# Patient Record
Sex: Female | Born: 1945 | Race: Black or African American | Hispanic: No | Smoking: Current every day smoker
Health system: Southern US, Community
[De-identification: ages and names within clinical notes are randomized; demographics above are authoritative.]

## PROBLEM LIST (undated history)

## (undated) DIAGNOSIS — I10 Essential (primary) hypertension: Secondary | ICD-10-CM

## (undated) DIAGNOSIS — E119 Type 2 diabetes mellitus without complications: Secondary | ICD-10-CM

---

## 2020-08-27 ENCOUNTER — Encounter (HOSPITAL_COMMUNITY): Payer: Self-pay | Admitting: Emergency Medicine

## 2020-08-27 ENCOUNTER — Inpatient Hospital Stay (HOSPITAL_COMMUNITY)
Admission: EM | Admit: 2020-08-27 | Discharge: 2020-08-29 | DRG: 310 | Disposition: A | Discharge status: Home Health | Payer: Medicare Other | Attending: Internal Medicine | Admitting: Internal Medicine

## 2020-08-27 ENCOUNTER — Inpatient Hospital Stay (HOSPITAL_COMMUNITY): Payer: Medicare Other

## 2020-08-27 ENCOUNTER — Emergency Department (HOSPITAL_COMMUNITY): Payer: Medicare Other

## 2020-08-27 ENCOUNTER — Other Ambulatory Visit: Payer: Self-pay

## 2020-08-27 DIAGNOSIS — I480 Paroxysmal atrial fibrillation: Secondary | ICD-10-CM | POA: Diagnosis present

## 2020-08-27 DIAGNOSIS — E1121 Type 2 diabetes mellitus with diabetic nephropathy: Secondary | ICD-10-CM | POA: Diagnosis not present

## 2020-08-27 DIAGNOSIS — N1832 Chronic kidney disease, stage 3b: Secondary | ICD-10-CM | POA: Diagnosis present

## 2020-08-27 DIAGNOSIS — Z888 Allergy status to other drugs, medicaments and biological substances status: Secondary | ICD-10-CM | POA: Diagnosis not present

## 2020-08-27 DIAGNOSIS — F17210 Nicotine dependence, cigarettes, uncomplicated: Secondary | ICD-10-CM | POA: Diagnosis present

## 2020-08-27 DIAGNOSIS — I129 Hypertensive chronic kidney disease with stage 1 through stage 4 chronic kidney disease, or unspecified chronic kidney disease: Secondary | ICD-10-CM | POA: Diagnosis present

## 2020-08-27 DIAGNOSIS — R079 Chest pain, unspecified: Secondary | ICD-10-CM

## 2020-08-27 DIAGNOSIS — R072 Precordial pain: Secondary | ICD-10-CM | POA: Diagnosis not present

## 2020-08-27 DIAGNOSIS — Z8249 Family history of ischemic heart disease and other diseases of the circulatory system: Secondary | ICD-10-CM

## 2020-08-27 DIAGNOSIS — K219 Gastro-esophageal reflux disease without esophagitis: Secondary | ICD-10-CM

## 2020-08-27 DIAGNOSIS — F419 Anxiety disorder, unspecified: Secondary | ICD-10-CM | POA: Diagnosis present

## 2020-08-27 DIAGNOSIS — Z885 Allergy status to narcotic agent status: Secondary | ICD-10-CM

## 2020-08-27 DIAGNOSIS — F32A Depression, unspecified: Secondary | ICD-10-CM | POA: Diagnosis present

## 2020-08-27 DIAGNOSIS — Z7984 Long term (current) use of oral hypoglycemic drugs: Secondary | ICD-10-CM | POA: Diagnosis not present

## 2020-08-27 DIAGNOSIS — E1122 Type 2 diabetes mellitus with diabetic chronic kidney disease: Secondary | ICD-10-CM | POA: Diagnosis present

## 2020-08-27 DIAGNOSIS — I4891 Unspecified atrial fibrillation: Secondary | ICD-10-CM

## 2020-08-27 DIAGNOSIS — Z833 Family history of diabetes mellitus: Secondary | ICD-10-CM | POA: Diagnosis not present

## 2020-08-27 DIAGNOSIS — F418 Other specified anxiety disorders: Secondary | ICD-10-CM

## 2020-08-27 DIAGNOSIS — I10 Essential (primary) hypertension: Secondary | ICD-10-CM

## 2020-08-27 DIAGNOSIS — Z79899 Other long term (current) drug therapy: Secondary | ICD-10-CM | POA: Diagnosis not present

## 2020-08-27 DIAGNOSIS — R413 Other amnesia: Secondary | ICD-10-CM | POA: Diagnosis present

## 2020-08-27 DIAGNOSIS — Z20822 Contact with and (suspected) exposure to covid-19: Secondary | ICD-10-CM | POA: Diagnosis present

## 2020-08-27 DIAGNOSIS — I472 Ventricular tachycardia: Secondary | ICD-10-CM | POA: Diagnosis not present

## 2020-08-27 DIAGNOSIS — R4189 Other symptoms and signs involving cognitive functions and awareness: Secondary | ICD-10-CM | POA: Diagnosis not present

## 2020-08-27 HISTORY — DX: Type 2 diabetes mellitus without complications: E11.9

## 2020-08-27 HISTORY — DX: Essential (primary) hypertension: I10

## 2020-08-27 LAB — BASIC METABOLIC PANEL
Anion gap: 10 (ref 5–15)
BUN: 29 mg/dL — ABNORMAL HIGH (ref 8–23)
CO2: 25 mmol/L (ref 22–32)
Calcium: 8.9 mg/dL (ref 8.9–10.3)
Chloride: 106 mmol/L (ref 98–111)
Creatinine, Ser: 1.2 mg/dL — ABNORMAL HIGH (ref 0.44–1.00)
GFR, Estimated: 44 mL/min — ABNORMAL LOW (ref >60)
Glucose, Bld: 126 mg/dL — ABNORMAL HIGH (ref 70–99)
Potassium: 3.7 mmol/L (ref 3.5–5.1)
Sodium: 141 mmol/L (ref 135–145)

## 2020-08-27 LAB — TROPONIN I (HIGH SENSITIVITY)
Troponin I (High Sensitivity): 25 ng/L — ABNORMAL HIGH (ref <18)
Troponin I (High Sensitivity): 150 ng/L (ref <18)

## 2020-08-27 LAB — TSH: TSH: 1.937 u[IU]/mL (ref 0.350–4.500)

## 2020-08-27 LAB — RESPIRATORY PANEL BY RT PCR (FLU A&B, COVID)
Influenza A by PCR: NEGATIVE
Influenza B by PCR: NEGATIVE
SARS Coronavirus 2 by RT PCR: NEGATIVE

## 2020-08-27 LAB — CBG MONITORING, ED
Glucose-Capillary: 150 mg/dL — ABNORMAL HIGH (ref 70–99)
Glucose-Capillary: 126 mg/dL — ABNORMAL HIGH (ref 70–99)

## 2020-08-27 LAB — CBC
HCT: 42.7 % (ref 36.0–46.0)
Hemoglobin: 13.4 g/dL (ref 12.0–15.0)
MCH: 29 pg (ref 26.0–34.0)
MCHC: 31.4 g/dL (ref 30.0–36.0)
MCV: 92.4 fL (ref 80.0–100.0)
Platelets: 261 10*3/uL (ref 150–400)
RBC: 4.62 MIL/uL (ref 3.87–5.11)
RDW: 14.3 % (ref 11.5–15.5)
WBC: 3.8 10*3/uL — ABNORMAL LOW (ref 4.0–10.5)
nRBC: 0 % (ref 0.0–0.2)

## 2020-08-27 LAB — ECHOCARDIOGRAM COMPLETE
Area-P 1/2: 2.22 cm2
Height: 64.5 in
S' Lateral: 1.76 cm
Weight: 2500.9 oz

## 2020-08-27 LAB — HEPARIN LEVEL (UNFRACTIONATED): Heparin Unfractionated: 0.94 IU/mL — ABNORMAL HIGH (ref 0.30–0.70)

## 2020-08-27 LAB — MAGNESIUM: Magnesium: 2.4 mg/dL (ref 1.7–2.4)

## 2020-08-27 LAB — BRAIN NATRIURETIC PEPTIDE: B Natriuretic Peptide: 67 pg/mL (ref 0.0–100.0)

## 2020-08-27 MED ORDER — DILTIAZEM HCL-DEXTROSE 125-5 MG/125ML-% IV SOLN (PREMIX)
5.0000 mg/h | INTRAVENOUS | Status: DC
  Administered 2020-08-27: 5 mg/h via INTRAVENOUS
  Filled 2020-08-27: qty 125

## 2020-08-27 MED ORDER — ONDANSETRON HCL 4 MG/2ML IJ SOLN: 4.0000 mg | Freq: Four times a day (QID) | INTRAMUSCULAR | Status: DC | PRN

## 2020-08-27 MED ORDER — DULOXETINE HCL 30 MG PO CPEP
30.0000 mg | ORAL_CAPSULE | Freq: Every day | ORAL | Status: DC
  Administered 2020-08-27 – 2020-08-29 (×3): 30 mg via ORAL
  Filled 2020-08-27 (×3): qty 1

## 2020-08-27 MED ORDER — DILTIAZEM HCL 60 MG PO TABS
60.0000 mg | ORAL_TABLET | Freq: Two times a day (BID) | ORAL | Status: DC
  Administered 2020-08-27 – 2020-08-29 (×4): 60 mg via ORAL
  Filled 2020-08-27: qty 2
  Filled 2020-08-27 (×3): qty 1

## 2020-08-27 MED ORDER — ALPRAZOLAM 0.25 MG PO TABS
0.2500 mg | ORAL_TABLET | Freq: Two times a day (BID) | ORAL | Status: DC | PRN
  Administered 2020-08-28: 0.25 mg via ORAL
  Filled 2020-08-27: qty 1

## 2020-08-27 MED ORDER — METOPROLOL TARTRATE 50 MG PO TABS
50.0000 mg | ORAL_TABLET | Freq: Two times a day (BID) | ORAL | Status: DC
  Administered 2020-08-27 – 2020-08-29 (×3): 50 mg via ORAL
  Filled 2020-08-27 (×4): qty 1

## 2020-08-27 MED ORDER — PANTOPRAZOLE SODIUM 40 MG PO TBEC
40.0000 mg | DELAYED_RELEASE_TABLET | Freq: Every day | ORAL | Status: DC
  Administered 2020-08-27 – 2020-08-29 (×3): 40 mg via ORAL
  Filled 2020-08-27 (×3): qty 1

## 2020-08-27 MED ORDER — HEPARIN (PORCINE) 25000 UT/250ML-% IV SOLN
900.0000 [IU]/h | INTRAVENOUS | Status: DC
  Administered 2020-08-27: 1150 [IU]/h via INTRAVENOUS
  Administered 2020-08-28: 900 [IU]/h via INTRAVENOUS
  Filled 2020-08-27: qty 250

## 2020-08-27 MED ORDER — INSULIN DETEMIR 100 UNIT/ML ~~LOC~~ SOLN
5.0000 [IU] | Freq: Every day | SUBCUTANEOUS | Status: DC
  Administered 2020-08-29: 5 [IU] via SUBCUTANEOUS
  Filled 2020-08-27 (×4): qty 0.05

## 2020-08-27 MED ORDER — INSULIN ASPART 100 UNIT/ML ~~LOC~~ SOLN
0.0000 [IU] | Freq: Three times a day (TID) | SUBCUTANEOUS | Status: DC
  Administered 2020-08-27 (×2): 1 [IU] via SUBCUTANEOUS
  Administered 2020-08-28: 2 [IU] via SUBCUTANEOUS
  Administered 2020-08-28 – 2020-08-29 (×2): 1 [IU] via SUBCUTANEOUS
  Filled 2020-08-27 (×2): qty 1

## 2020-08-27 MED ORDER — ONDANSETRON HCL 4 MG PO TABS: 4.0000 mg | ORAL_TABLET | Freq: Four times a day (QID) | ORAL | Status: DC | PRN

## 2020-08-27 MED ORDER — HEPARIN BOLUS VIA INFUSION
4000.0000 [IU] | Freq: Once | INTRAVENOUS | Status: AC
  Administered 2020-08-27: 4000 [IU] via INTRAVENOUS

## 2020-08-27 MED ORDER — ACETAMINOPHEN 325 MG PO TABS
650.0000 mg | ORAL_TABLET | ORAL | Status: DC | PRN
  Administered 2020-08-28: 650 mg via ORAL
  Filled 2020-08-27: qty 2

## 2020-08-27 MED ORDER — SODIUM CHLORIDE 0.9 % IV BOLUS
500.0000 mL | Freq: Once | INTRAVENOUS | Status: AC
  Administered 2020-08-27: 500 mL via INTRAVENOUS

## 2020-08-27 NOTE — ED Notes (Signed)
EKG done pt in SR 60's.

## 2020-08-27 NOTE — Progress Notes (Signed)
*  PRELIMINARY RESULTS* Echocardiogram 2D Echocardiogram has been performed.  Jeryl Columbia 08/27/2020, 12:33 PM

## 2020-08-27 NOTE — ED Notes (Signed)
Date and time results received: 08/27/20 1049   Test: TROP Critical Value: 150  Name of Provider Notified: LONG  Orders Received? Or Actions Taken?: Waiting orders, nurse aware

## 2020-08-27 NOTE — H&P (Signed)
History and Physical    Divya Munshi ZOX:[id number] DOB: 31-May-1946 DOA: 08/27/2020  PCP: Pcp, No   Patient coming from: home  Chief Complaint: chest discomfort and SOB.  HPI: Felicia Randolph is a 74 y.o. female with medical history significant of hypertension, gastroesophageal reflux disease, type 2 diabetes and depression; who presented to the hospital secondary to chest discomfort and shortness of breath.  Patient reports symptoms have been present on and off for the last for 5 days but is slightly more severe on the morning of presentation; pain is localized in the medial left side aspect of her chest, nonradiating, 6 out of 10 in intensity and lasting over 15-20 minutes.  Patient reports no fever, no coughing, no shortness of breath, no nausea, no vomiting, no abdominal pain, no hematuria, no dysuria, no sick contacts or any other complaints.  She reports no close follow-up with her primary care doctor and recently some medication noncompliance.  ED Course: Work-up demonstrated patient to be in A. fib with RVR on presentation, mild elevation in her troponin and improvement in her symptoms after controlling rate with Cardizem drip.  TRH has been contacted to place patient in hospital for further evaluation and management.  Cardiology was also consulted due to mild elevation in her troponin.  Chest x-ray demonstrates no acute infiltrates or cardiopulmonary process.  Review of Systems: As per HPI otherwise all other systems reviewed and are negative.  Past Medical History:  Diagnosis Date  . Hypertension     History reviewed. No pertinent surgical history.  Social History  reports that she has been smoking. She has been smoking about 0.50 packs per day. She has never used smokeless tobacco. She reports current alcohol use. She reports that she does not use drugs.  Allergies  Allergen Reactions  . Ace Inhibitors Other (See Comments)  . Hydrocodone-Acetaminophen Anxiety  .  Oxycodone Anxiety    agitation and confusion   Family history: According to patient history of hypertension otherwise noncontributory.  Prior to Admission medications   Medication Sig Start Date End Date Taking? Authorizing Provider  Aspirin-Salicylamide-Caffeine (BC FAST PAIN RELIEF) [phone] MG PACK Take 1 packet by mouth as needed.   Yes [provider]  baclofen (LIORESAL) 10 MG tablet Take 5 mg by mouth 2 (two) times daily as needed. Patient not taking: Reported on 08/27/2020    [provider]  Cholecalciferol 50 MCG (2000 UT) TABS Take 2,000 Units by mouth daily. Patient not taking: Reported on 08/27/2020 08/05/15   [provider]  DULoxetine (CYMBALTA) 60 MG capsule Take 60 mg by mouth daily. Patient not taking: Reported on 08/27/2020 08/05/15   [provider]  metFORMIN (GLUCOPHAGE) 500 MG tablet Take 500 mg by mouth daily. Patient not taking: Reported on 08/27/2020 08/05/15   [provider]  metoprolol tartrate (LOPRESSOR) 50 MG tablet Take 50 mg by mouth in the morning and at bedtime. Patient not taking: Reported on 08/27/2020    [provider]  omeprazole (PRILOSEC) 20 MG capsule Take 20 mg by mouth daily. Patient not taking: Reported on 08/27/2020    [provider]    Physical Exam: Vitals:   08/27/20 0846 08/27/20 0900 08/27/20 0930 08/27/20 1000  BP:  (!) 123/104 125/90 (!) 128/95  Pulse:  (!) 133 (!) 133 (!) 143  Resp:  (!) 23 (!) 21 (!) 22  Temp:      TempSrc:      SpO2:  97% 99% 100%  Weight: 70.9 kg  Height: 5' 4.5" (1.638 m)       Constitutional: Afebrile, not nausea, no vomiting, no fever.  Reports improvement in her chest discomfort. Vitals:   08/27/20 0846 08/27/20 0900 08/27/20 0930 08/27/20 1000  BP:  (!) 123/104 125/90 (!) 128/95  Pulse:  (!) 133 (!) 133 (!) 143  Resp:  (!) 23 (!) 21 (!) 22  Temp:      TempSrc:      SpO2:  97% 99% 100%  Weight: 70.9 kg     Height: 5' 4.5"  (1.638 m)      Eyes: PERRL, lids and conjunctivae normal; no icterus, no nystagmus. ENMT: Mucous membranes are moist. Posterior pharynx clear of any exudate or lesions. Neck: normal, supple, no masses, no thyromegaly Respiratory: clear to auscultation bilaterally, no wheezing, no crackles.  No using accessory muscles. Cardiovascular: Irregular irregular; no rubs, no gallops, no JVD appreciated on exam.  Abdomen: no tenderness, no masses palpated. No hepatosplenomegaly. Bowel sounds positive.  Musculoskeletal: no clubbing / cyanosis. No joint deformity upper and lower extremities. Good ROM, no contractures. Normal muscle tone.  Skin: no rashes, no petechiae Neurologic: CN 2-12 grossly intact. Sensation intact, DTR normal. Strength 4/5 in 4 limbs due to poor effort. Psychiatric: Normal judgment and insight. Alert and oriented x 3. Normal mood.   Labs on Admission: I have personally reviewed following labs and imaging studies  CBC: Recent Labs  Lab 08/27/20 0835  WBC 3.8*  HGB 13.4  HCT 42.7  MCV 92.4  PLT 261    Basic Metabolic Panel: Recent Labs  Lab 08/27/20 0835  NA 141  K 3.7  CL 106  CO2 25  GLUCOSE 126*  BUN 29*  CREATININE 1.20*  CALCIUM 8.9    GFR: Estimated Creatinine Clearance: 39 mL/min (A) (by C-G formula based on SCr of 1.2 mg/dL (H)).  Urine analysis: No results found for: COLORURINE, APPEARANCEUR, LABSPEC, PHURINE, GLUCOSEU, HGBUR, BILIRUBINUR, KETONESUR, PROTEINUR, UROBILINOGEN, NITRITE, LEUKOCYTESUR  Radiological Exams on Admission: DG Chest Port 1 View  Result Date: 08/27/2020 CLINICAL DATA:  Chest pain.  Hypertension. EXAM: PORTABLE CHEST 1 VIEW COMPARISON:  None. FINDINGS: No edema or airspace opacity. Heart is mildly enlarged with pulmonary vascularity normal. No adenopathy. There is aortic atherosclerosis. There is degenerative change in each shoulder. Bones are osteoporotic. IMPRESSION: Lungs clear. Mild cardiac enlargement. No adenopathy. Bones  osteoporotic. Aortic Atherosclerosis (ICD10-I70.0). Electronically Signed   By: Bretta Bang III M.D.   On: 08/27/2020 09:04    EKG: Independently reviewed.  A. fib with RVR.  Assessment/Plan 1-chest pain/mild troponin elevation -In the setting of A. fib with RVR -Patient will be admitted to the hospital stepdown bed -Cardizem drip has been started and metoprolol will be also resumed -Check 2D echo -Heparin drip initiated -Cardiology service consulted.  2-hypertension -Stable and currently well controlled -Anticipate stability in her vital signs while using Cardizem and metoprolol  3-type 2 diabetes -Holding oral hypoglycemics while inpatient -Will check A1c -Sliding scale insulin and low-dose Levemir added. -Modified carbohydrate diet ordered  4-gastroesophageal reflux disease -Continue PPI  5-depression/anxiety -Continue Cymbalta and as needed Xanax. -Stable mood no suicidal ideation at this time.   DVT prophylaxis: Heparin drip Code Status:   Full code Family Communication: No family at bedside. Disposition Plan:   Patient is from:  Home  Anticipated DC to:  Home  Anticipated DC date:  08/29/20  Anticipated DC barriers: Stabilization of her HR and resolution of CP.  Consults called:  Cardiology service Admission status:  Inpatient, stepdown, length of stay more than 2 midnights.  Severity of Illness: Moderate severity; patient presented with A. fib with RVR and chest pain.  Mild troponin elevation.  Started on heparin drip, Cardizem drip for rate control and cardiology service consultation.  Will follow 2D echo.  Patient with heart score of 3-4.   Vassie Loll MD [facility]  How to contact the [facility] Attending or Consulting provider 7A - 7P or covering provider during after hours 7P -7A, for this patient?   1. Check the care team in [facility] and look for a) attending/consulting [facility] provider listed and b) the [facility] team listed 2. Log into [email or web address] and  use [place]'s universal password to access. If you do not have the password, please contact the hospital operator. 3. Locate the [facility] provider you are looking for under [facility] and page to a number that you can be directly reached. 4. If you still have difficulty reaching the provider, please page the [facility] (Director on Call) for the Hospitalists listed on amion for assistance.  08/27/2020, 11:07 AM

## 2020-08-27 NOTE — ED Triage Notes (Signed)
Pt states her daughter called EMS because she was having cp.   Pt denies any cp at this time but c/o of congestion. EMS given 325 of ASA and one nitro

## 2020-08-27 NOTE — ED Provider Notes (Signed)
Emergency Department Provider Note   I have reviewed the triage vital signs and the nursing notes.   HISTORY  Chief Complaint Chest Pain   HPI Felicia Randolph is a 74 y.o. female with PMH of HTN presents to the emergency department for evaluation and of chest pain that started this AM.  Patient tells me that she has had this similar sensation in the past including recently.  She denies shortness of breath, fever, other provoking factors.  No known prior history of ACS or A. Fib.  She tells me that she has a PCP at [facility] and has followed with them in the past year.  She is not feeling lightheaded or experiencing syncope. No radiation of symptoms or modifying factors.    Past Medical History:  Diagnosis Date  . Hypertension     Patient Active Problem List   Diagnosis Date Noted  . Atrial fibrillation with RVR (HCC) 08/27/2020    History reviewed. No pertinent surgical history.  Allergies Ace inhibitors, Hydrocodone-acetaminophen, and Oxycodone  History reviewed. No pertinent family history.  Social History Social History   Tobacco Use  . Smoking status: Current Every Day Smoker    Packs/day: 0.50  . Smokeless tobacco: Never Used  Substance Use Topics  . Alcohol use: Yes    Comment: occ  . Drug use: Never    Review of Systems  Constitutional: No fever/chills Eyes: No visual changes. ENT: No sore throat. Cardiovascular: Positive CP.  Respiratory: Denies shortness of breath. Gastrointestinal: No abdominal pain.  No nausea, no vomiting.  No diarrhea.  No constipation. Genitourinary: Negative for dysuria. Musculoskeletal: Negative for back pain. Skin: Negative for rash. Neurological: Negative for headaches, focal weakness or numbness.  10-point ROS otherwise negative.  ____________________________________________   PHYSICAL EXAM:  VITAL SIGNS: ED Triage Vitals  Enc Vitals Group     BP 08/27/20 0810 104/65     Pulse Rate 08/27/20 0810 (!) 135      Resp 08/27/20 0810 (!) 22     Temp 08/27/20 0810 97.6 F (36.4 C)     Temp Source 08/27/20 0810 Oral     SpO2 08/27/20 0810 95 %   Constitutional: Alert and oriented. Well appearing and in no acute distress. Eyes: Conjunctivae are normal.  Head: Atraumatic. Nose: No congestion/rhinnorhea. Mouth/Throat: Mucous membranes are moist.  Neck: No stridor.   Cardiovascular: Irregularly irregular. Good peripheral circulation. Grossly normal heart sounds.   Respiratory: Normal respiratory effort.  No retractions. Lungs CTAB. Gastrointestinal: No distention.  Musculoskeletal: No gross deformities of extremities. Neurologic:  Normal speech and language.  Skin:  Skin is warm, dry and intact. No rash noted.  ____________________________________________   LABS (all labs ordered are listed, but only abnormal results are displayed)  Labs Reviewed  BASIC METABOLIC PANEL - Abnormal; Notable for the following components:      Result Value   Glucose, Bld 126 (*)    BUN 29 (*)    Creatinine, Ser 1.20 (*)    GFR, Estimated 44 (*)    All other components within normal limits  CBC - Abnormal; Notable for the following components:   WBC 3.8 (*)    All other components within normal limits  HEPARIN LEVEL (UNFRACTIONATED) - Abnormal; Notable for the following components:   Heparin Unfractionated 0.94 (*)    All other components within normal limits  HEMOGLOBIN A1C - Abnormal; Notable for the following components:   Hgb A1c MFr Bld 6.0 (*)    All other components within  normal limits  LIPID PANEL - Abnormal; Notable for the following components:   LDL Cholesterol 107 (*)    All other components within normal limits  BASIC METABOLIC PANEL - Abnormal; Notable for the following components:   BUN 28 (*)    Creatinine, Ser 1.17 (*)    Calcium 8.5 (*)    GFR, Estimated 45 (*)    All other components within normal limits  HEPARIN LEVEL (UNFRACTIONATED) - Abnormal; Notable for the following components:     Heparin Unfractionated 0.75 (*)    All other components within normal limits  CBG MONITORING, ED - Abnormal; Notable for the following components:   Glucose-Capillary 150 (*)    All other components within normal limits  CBG MONITORING, ED - Abnormal; Notable for the following components:   Glucose-Capillary 126 (*)    All other components within normal limits  TROPONIN I (HIGH SENSITIVITY) - Abnormal; Notable for the following components:   Troponin I (High Sensitivity) 25 (*)    All other components within normal limits  TROPONIN I (HIGH SENSITIVITY) - Abnormal; Notable for the following components:   Troponin I (High Sensitivity) 150 (*)    All other components within normal limits  RESPIRATORY PANEL BY RT PCR (FLU A&B, COVID)  MRSA PCR SCREENING  BRAIN NATRIURETIC PEPTIDE  TSH  MAGNESIUM  PROTIME-INR  CBC  GLUCOSE, CAPILLARY  HEPARIN LEVEL (UNFRACTIONATED)   ____________________________________________  EKG   EKG Interpretation  Date/Time:  Thursday August 27 2020 08:11:18 EDT Ventricular Rate:  138 PR Interval:    QRS Duration: 96 QT Interval:  340 QTC Calculation: 516 R Axis:   139 Text Interpretation: Atrial fibrillation Low voltage, extremity leads Abnormal lateral Q waves Prolonged QT interval No STEMI Confirmed by Alona Bene [phone]) on 08/27/2020 8:16:34 AM       ____________________________________________  RADIOLOGY  ECHOCARDIOGRAM COMPLETE  Result Date: 08/27/2020    ECHOCARDIOGRAM REPORT   Patient Name:   Felicia Randolph Date of Exam: 08/27/2020 Medical Rec #:  [id number]        Height:       64.5 in Accession #:    [id number]       Weight:       156.3 lb Date of Birth:  06-12-1946         BSA:          1.771 m Patient Age:    74 years         BP:           147/82 mmHg Patient Gender: F                HR:           84 bpm. [facility] Procedure: 2D Echo Indications:    Atrial Fibrillation 427.31 / I48.91  History:        Patient has  no prior history of Echocardiogram examinations.                 Arrythmias:Atrial Fibrillation, Signs/Symptoms:Chest Pain; Risk                 Factors:Hypertension.  Sonographer:    Jeryl Columbia RDCS (AE) Referring Phys: [id number] CARLOS MADERA IMPRESSIONS  1. Left ventricular ejection fraction, by estimation, is 60 to 65%. The left ventricle has normal function. The left ventricle has no regional wall motion abnormalities. There is moderate left ventricular hypertrophy. Left ventricular diastolic parameters are consistent with Grade I diastolic dysfunction (impaired  relaxation).  2. Right ventricular systolic function is normal. The right ventricular size is normal.  3. The mitral valve is grossly normal. Trivial mitral valve regurgitation.  4. The aortic valve is tricuspid. There is mild calcification of the aortic valve. Aortic valve regurgitation is not visualized.  5. The inferior vena cava is normal in size with greater than 50% respiratory variability, suggesting right atrial pressure of 3 mmHg. FINDINGS  Left Ventricle: Left ventricular ejection fraction, by estimation, is 60 to 65%. The left ventricle has normal function. The left ventricle has no regional wall motion abnormalities. The left ventricular internal cavity size was normal in size. There is  moderate left ventricular hypertrophy. Left ventricular diastolic parameters are consistent with Grade I diastolic dysfunction (impaired relaxation). Right Ventricle: The right ventricular size is normal. No increase in right ventricular wall thickness. Right ventricular systolic function is normal. Left Atrium: Left atrial size was normal in size. Right Atrium: Right atrial size was normal in size. Pericardium: There is no evidence of pericardial effusion. Mitral Valve: The mitral valve is grossly normal. Mild to moderate mitral annular calcification. Trivial mitral valve regurgitation. Tricuspid Valve: The tricuspid valve is grossly normal. Tricuspid valve  regurgitation is trivial. Aortic Valve: The aortic valve is tricuspid. There is mild calcification of the aortic valve. There is mild aortic valve annular calcification. Aortic valve regurgitation is not visualized. Pulmonic Valve: The pulmonic valve was grossly normal. Pulmonic valve regurgitation is trivial. Aorta: The aortic root is normal in size and structure. Venous: The inferior vena cava is normal in size with greater than 50% respiratory variability, suggesting right atrial pressure of 3 mmHg. IAS/Shunts: The interatrial septum appears to be lipomatous. No atrial level shunt detected by color flow Doppler.  LEFT VENTRICLE PLAX 2D LVIDd:         3.32 cm  Diastology LVIDs:         1.76 cm  LV e' medial:    4.13 cm/s LV PW:         1.39 cm  LV E/e' medial:  12.3 LV IVS:        1.32 cm  LV e' lateral:   6.85 cm/s LVOT diam:     1.80 cm  LV E/e' lateral: 7.4 LVOT Area:     2.54 cm  RIGHT VENTRICLE RV S prime:     11.60 cm/s TAPSE (M-mode): 2.6 cm LEFT ATRIUM             Index       RIGHT ATRIUM           Index LA diam:        3.50 cm 1.98 cm/m  RA Area:     12.50 cm LA Vol (A2C):   28.1 ml 15.86 ml/m RA Volume:   31.50 ml  17.78 ml/m LA Vol (A4C):   29.0 ml 16.37 ml/m LA Biplane Vol: 28.5 ml 16.09 ml/m   AORTA Ao Root diam: 2.80 cm MITRAL VALVE               TRICUSPID VALVE MV Area (PHT): 2.22 cm    TR Peak grad:   13.4 mmHg MV Decel Time: 342 msec    TR Vmax:        183.00 cm/s MV E velocity: 50.60 cm/s MV A velocity: 75.80 cm/s  SHUNTS MV E/A ratio:  0.67        Systemic Diam: 1.80 cm Nona DellSamuel Mcdowell MD Electronically signed by Nona DellSamuel Mcdowell MD Signature Date/Time:  08/27/2020/3:58:09 PM    Final     ____________________________________________   PROCEDURES  Procedure(s) performed:   Procedures  CRITICAL CARE Performed by: Maia Plan Total critical care time: 35 minutes Critical care time was exclusive of separately billable procedures and treating other patients. Critical care was  necessary to treat or prevent imminent or life-threatening deterioration. Critical care was time spent personally by me on the following activities: development of treatment plan with patient and/or surrogate as well as nursing, discussions with consultants, evaluation of patient's response to treatment, examination of patient, obtaining history from patient or surrogate, ordering and performing treatments and interventions, ordering and review of laboratory studies, ordering and review of radiographic studies, pulse oximetry and re-evaluation of patient's condition.  Alona Bene, MD Emergency Medicine  ____________________________________________   INITIAL IMPRESSION / ASSESSMENT AND PLAN / ED COURSE  Pertinent labs & imaging results that were available during my care of the patient were reviewed by me and considered in my medical decision making (see chart for details).   Patient presents to [facility] emergency department with chest pain starting this morning.  She arrives in A. fib with RVR.  Plan is for primarily rate control in this patient as she has had similar sensations in the past. No contraindication for heparin.   Labs reviewed. Patient's rate is well controlled here on Diltiazem. Heparin running. Plan for admit.   Discussed patient's case with [facility] to request admission. Patient and family (if present) updated with plan. Care transferred to [facility] service.  I reviewed all nursing notes, vitals, pertinent old records, EKGs, labs, imaging (as available).  ____________________________________________  FINAL CLINICAL IMPRESSION(S) / ED DIAGNOSES  Final diagnoses:  Precordial chest pain  Atrial fibrillation with RVR (HCC)     MEDICATIONS GIVEN DURING THIS VISIT:  Medications  heparin ADULT infusion 100 units/mL (25000 units/259mL sodium chloride 0.45%) (900 Units/hr Intravenous Rate/Dose Verify 08/28/20 0647)  metoprolol tartrate (LOPRESSOR) tablet 50 mg (50 mg Oral Not Given  08/27/20 2252)  pantoprazole (PROTONIX) EC tablet 40 mg (40 mg Oral Given 08/27/20 1112)  DULoxetine (CYMBALTA) DR capsule 30 mg (30 mg Oral Given 08/27/20 1112)  insulin detemir (LEVEMIR) injection 5 Units (5 Units Subcutaneous Not Given 08/27/20 2251)  insulin aspart (novoLOG) injection 0-9 Units (1 Units Subcutaneous Given 08/27/20 1736)  acetaminophen (TYLENOL) tablet 650 mg (650 mg Oral Given 08/28/20 0120)  ondansetron (ZOFRAN) injection 4 mg (has no administration in time range)  ondansetron (ZOFRAN) tablet 4 mg (has no administration in time range)    Or  ondansetron (ZOFRAN) injection 4 mg (has no administration in time range)  ALPRAZolam (XANAX) tablet 0.25 mg (0.25 mg Oral Given 08/28/20 0119)  diltiazem (CARDIZEM) tablet 60 mg (60 mg Oral Not Given 08/27/20 2252)  Chlorhexidine Gluconate Cloth 2 % PADS 6 each (has no administration in time range)  sodium chloride 0.9 % bolus 500 mL (0 mLs Intravenous Stopped 08/27/20 1347)  heparin bolus via infusion 4,000 Units (4,000 Units Intravenous Bolus from Bag 08/27/20 0919)     Note:  This document was prepared using Dragon voice recognition software and may include unintentional dictation errors.  Alona Bene, MD, [facility] Emergency Medicine    Mykael Batz, Arlyss Repress, MD 08/28/20 [phone]

## 2020-08-27 NOTE — Progress Notes (Signed)
ANTICOAGULATION CONSULT NOTE - Initial Consult  Pharmacy Consult for heparin gtt  Indication: chest pain/ACS  Not on File  Patient Measurements: Height: 5' 4.5" (163.8 cm) Weight: 70.9 kg (156 lb 4.9 oz) IBW/kg (Calculated) : 55.85 Heparin Dosing Weight: HEPARIN DW (KG): 70.1   Vital Signs: Temp: 97.6 F (36.4 C) (10/14 0810) Temp Source: Oral (10/14 0810) BP: 104/65 (10/14 0810) Pulse Rate: 135 (10/14 0810)  Labs: No results for input(s): HGB, HCT, PLT, APTT, LABPROT, INR, HEPARINUNFRC, HEPRLOWMOCWT, CREATININE, CKTOTAL, CKMB, TROPONINI in the last 72 hours.  CrCl cannot be calculated (No successful lab value found.).   Medical History: Past Medical History:  Diagnosis Date  . Hypertension     Medications:  (Not in a hospital admission)  Scheduled:  . heparin  4,000 Units Intravenous Once   Infusions:  . diltiazem (CARDIZEM) infusion    . heparin    . sodium chloride     PRN:  Anti-infectives (From admission, onward)   None      Assessment: Felicia Randolph a 74 y.o. female requires anticoagulation with a heparin iv infusion for the indication of  chest pain/ACS. Heparin gtt will be started following pharmacy protocol per pharmacy consult. Prior to starting heparin gtt, patient was asked by RN Bard Herbert if they were taking anticoagulants at home, patient denied.   Goal of Therapy:  Heparin level 0.3-0.7 units/ml Monitor platelets by anticoagulation protocol: Yes   Plan:  Give 4000 units bolus x 1 Start heparin infusion at 1150 units/hr Check anti-Xa level in 8 hours and daily while on heparin Continue to monitor H&H and platelets  Heparin level to be drawn in 8 hours for patients >15 years old or crcl < 9ml/min  Sequan Auxier 08/27/2020,8:54 AM

## 2020-08-27 NOTE — Progress Notes (Signed)
ANTICOAGULATION CONSULT NOTE -   Pharmacy Consult for heparin gtt  Indication: chest pain/ACS  Allergies  Allergen Reactions  . Ace Inhibitors Other (See Comments)  . Hydrocodone-Acetaminophen Anxiety  . Oxycodone Anxiety    agitation and confusion    Patient Measurements: Height: 5' 4.5" (163.8 cm) Weight: 70.9 kg (156 lb 4.9 oz) IBW/kg (Calculated) : 55.85 Heparin Dosing Weight: HEPARIN DW (KG): 70.1   Vital Signs: Temp: 97.6 F (36.4 C) (10/14 0810) Temp Source: Oral (10/14 0810) BP: 154/54 (10/14 1610) Pulse Rate: 64 (10/14 1448)  Labs: Recent Labs    08/27/20 0835 08/27/20 1624  HGB 13.4  --   HCT 42.7  --   PLT 261  --   HEPARINUNFRC  --  0.94*  CREATININE 1.20*  --     Estimated Creatinine Clearance: 39 mL/min (A) (by C-G formula based on SCr of 1.2 mg/dL (H)).   Medical History: Past Medical History:  Diagnosis Date  . Hypertension     Medications:  (Not in a hospital admission)  Scheduled:  . diltiazem  60 mg Oral Q12H  . DULoxetine  30 mg Oral Daily  . insulin aspart  0-9 Units Subcutaneous TID WC  . insulin detemir  5 Units Subcutaneous QHS  . metoprolol tartrate  50 mg Oral BID  . pantoprazole  40 mg Oral Daily   Infusions:  . heparin 1,150 Units/hr (08/27/20 0916)   PRN:  Anti-infectives (From admission, onward)   None      Assessment: Felicia Randolph a 74 y.o. female requires anticoagulation with a heparin iv infusion for the indication of  chest pain/ACS. Heparin gtt will be started following pharmacy protocol per pharmacy consult.   HL 0.94- supratherapeutic   Goal of Therapy:  Heparin level 0.3-0.7 units/ml Monitor platelets by anticoagulation protocol: Yes   Plan:  Decrease heparin infusion to 1000 units/hr Check anti-Xa level in 8 hours and daily Continue to monitor H&H and platelets    Felicia Randolph, PharmD Clinical Pharmacist 08/27/2020 5:05 PM

## 2020-08-28 ENCOUNTER — Encounter (HOSPITAL_COMMUNITY): Payer: Self-pay | Admitting: Internal Medicine

## 2020-08-28 DIAGNOSIS — I10 Essential (primary) hypertension: Secondary | ICD-10-CM | POA: Diagnosis not present

## 2020-08-28 DIAGNOSIS — I472 Ventricular tachycardia: Secondary | ICD-10-CM

## 2020-08-28 DIAGNOSIS — R4189 Other symptoms and signs involving cognitive functions and awareness: Secondary | ICD-10-CM | POA: Diagnosis not present

## 2020-08-28 DIAGNOSIS — F418 Other specified anxiety disorders: Secondary | ICD-10-CM

## 2020-08-28 DIAGNOSIS — I480 Paroxysmal atrial fibrillation: Principal | ICD-10-CM

## 2020-08-28 LAB — GLUCOSE, CAPILLARY
Glucose-Capillary: 82 mg/dL (ref 70–99)
Glucose-Capillary: 137 mg/dL — ABNORMAL HIGH (ref 70–99)
Glucose-Capillary: 135 mg/dL — ABNORMAL HIGH (ref 70–99)
Glucose-Capillary: 106 mg/dL — ABNORMAL HIGH (ref 70–99)

## 2020-08-28 LAB — CBC
HCT: 38.9 % (ref 36.0–46.0)
Hemoglobin: 12.5 g/dL (ref 12.0–15.0)
MCH: 29.6 pg (ref 26.0–34.0)
MCHC: 32.1 g/dL (ref 30.0–36.0)
MCV: 92 fL (ref 80.0–100.0)
Platelets: 237 10*3/uL (ref 150–400)
RBC: 4.23 MIL/uL (ref 3.87–5.11)
RDW: 14.5 % (ref 11.5–15.5)
WBC: 4.8 10*3/uL (ref 4.0–10.5)
nRBC: 0 % (ref 0.0–0.2)

## 2020-08-28 LAB — BASIC METABOLIC PANEL
Anion gap: 10 (ref 5–15)
BUN: 28 mg/dL — ABNORMAL HIGH (ref 8–23)
CO2: 22 mmol/L (ref 22–32)
Calcium: 8.5 mg/dL — ABNORMAL LOW (ref 8.9–10.3)
Chloride: 107 mmol/L (ref 98–111)
Creatinine, Ser: 1.17 mg/dL — ABNORMAL HIGH (ref 0.44–1.00)
GFR, Estimated: 45 mL/min — ABNORMAL LOW (ref >60)
Glucose, Bld: 99 mg/dL (ref 70–99)
Potassium: 4.1 mmol/L (ref 3.5–5.1)
Sodium: 139 mmol/L (ref 135–145)

## 2020-08-28 LAB — LIPID PANEL
Cholesterol: 181 mg/dL (ref 0–200)
HDL: 59 mg/dL (ref >40)
LDL Cholesterol: 107 mg/dL — ABNORMAL HIGH (ref 0–99)
Total CHOL/HDL Ratio: 3.1 RATIO
Triglycerides: 74 mg/dL (ref <150)
VLDL: 15 mg/dL (ref 0–40)

## 2020-08-28 LAB — PROTIME-INR
INR: 1.1 (ref 0.8–1.2)
Prothrombin Time: 13.7 seconds (ref 11.4–15.2)

## 2020-08-28 LAB — HEMOGLOBIN A1C
Hgb A1c MFr Bld: 6 % — ABNORMAL HIGH (ref 4.8–5.6)
Mean Plasma Glucose: 126 mg/dL

## 2020-08-28 LAB — MRSA PCR SCREENING: MRSA by PCR: NEGATIVE

## 2020-08-28 LAB — HEPARIN LEVEL (UNFRACTIONATED)
Heparin Unfractionated: 0.75 IU/mL — ABNORMAL HIGH (ref 0.30–0.70)
Heparin Unfractionated: 0.77 IU/mL — ABNORMAL HIGH (ref 0.30–0.70)

## 2020-08-28 MED ORDER — APIXABAN 5 MG PO TABS
5.0000 mg | ORAL_TABLET | Freq: Two times a day (BID) | ORAL | Status: DC
  Administered 2020-08-28 – 2020-08-29 (×3): 5 mg via ORAL
  Filled 2020-08-28 (×3): qty 1

## 2020-08-28 MED ORDER — CHLORHEXIDINE GLUCONATE CLOTH 2 % EX PADS
6.0000 | MEDICATED_PAD | Freq: Every day | CUTANEOUS | Status: DC
  Administered 2020-08-28: 6 via TOPICAL

## 2020-08-28 NOTE — Progress Notes (Signed)
PROGRESS NOTE    Carlynn Leduc  QIH:[id number] DOB: 09-Dec-1945 DOA: 08/27/2020 PCP: Pcp, No    Chief Complaint  Patient presents with  . Chest Pain    Brief Narrative:  Felicia Randolph is a 74 y.o. female with medical history significant of hypertension, gastroesophageal reflux disease, type 2 diabetes and depression; who presented to the hospital secondary to chest discomfort and shortness of breath.  Patient reports symptoms have been present on and off for the last for 5 days but is slightly more severe on the morning of presentation; pain is localized in the medial left side aspect of her chest, nonradiating, 6 out of 10 in intensity and lasting over 15-20 minutes.  Patient reports no fever, no coughing, no shortness of breath, no nausea, no vomiting, no abdominal pain, no hematuria, no dysuria, no sick contacts or any other complaints.  She reports no close follow-up with her primary care doctor and recently some medication noncompliance.  ED Course: Work-up demonstrated patient to be in A. fib with RVR on presentation, mild elevation in her troponin and improvement in her symptoms after controlling rate with Cardizem drip.  TRH has been contacted to place patient in hospital for further evaluation and management.  Cardiology was also consulted due to mild elevation in her troponin.  Chest x-ray demonstrates no acute infiltrates or cardiopulmonary process.   Assessment & Plan: 1-chest pain/A. fib with RVR -Currently chest pain-free and denies palpitations -Patient converted throughout the night after receiving Cardizem drip and metoprolol -2D echo reassuring and per cardiology's recommendations safe to transition to Eliquis as CHADSVASC score is 6 -We will assess physical therapy evaluation for capability and any fluctuation on her right control -If remains good will be able to discharge tomorrow 08/29/2020 on Eliquis, extended release Cardizem once a day 120 mg and  metoprolol.  2-hypertension -Stable overall -Continue to follow blood pressure and further adjust regimen as required.  3-type 2 diabetes with nephropathy -A1c 6.0 -Continue sliding scale insulin while inpatient -Safe to resume home oral hypoglycemic agents and modify carbohydrate diet  4-stage IIIb chronic kidney disease -Appears to be stable and overall at baseline -Advised to maintain adequate hydration -Will be judicious about the use of nephrotoxic agents.  5-GERD -continue PPI  6-depression/anxiety -No suicidal ideation hallucination -Mood is a stable -Will continue the use of Cymbalta and as needed Xanax.  DVT prophylaxis: Eliquis. Code Status: Full code Family Communication: No family at bedside; patient expressed that she will update her family members with my PC details. Disposition:   Status is: Inpatient  Dispo: The patient is from: Home              Anticipated d/c is to: To be determined              Anticipated d/c date is: 08/29/20              Patient currently no medically stable for discharge; patient feeling weak, tired and has no pain "evaluated by PT to assess any fluctuation in her heart rate control.  She is off Cardizem drip so far converted back into sinus rhythm with good management.  Following cardiology recommendations heparin drip has been discontinued and patient transition to Eliquis with continuation of beta-blocker and 60 mg of Cardizem twice a day; if remains stable anticipate discharge tomorrow with extended release Cardizem..   Consultants:   Cardiology service   Procedures:  See below for x-ray reports 2D echo: 1. Left ventricular ejection fraction, by  estimation, is 60 to 65%. The  left ventricle has normal function. The left ventricle has no regional  wall motion abnormalities. There is moderate left ventricular hypertrophy.  Left ventricular diastolic  parameters are consistent with Grade I diastolic dysfunction (impaired   relaxation).  2. Right ventricular systolic function is normal. The right ventricular  size is normal.  3. The mitral valve is grossly normal. Trivial mitral valve  regurgitation.  4. The aortic valve is tricuspid. There is mild calcification of the  aortic valve. Aortic valve regurgitation is not visualized.  5. The inferior vena cava is normal in size with greater than 50%  respiratory variability, suggesting right atrial pressure of 3 mmHg.    Antimicrobials:  None   Subjective: Feeling better, denying chest pain, shortness of breath and palpitations.  Patient is afebrile.  Expressed feeling weak and tired otherwise no acute distress or complaints.  Objective: Vitals:   08/28/20 0500 08/28/20 0600 08/28/20 0814 08/28/20 1100  BP: (!) 173/93 (!) 174/89    Pulse: (!) 56 (!) 56    Resp: 12 13    Temp:   98.8 F (37.1 C) 97.6 F (36.4 C)  TempSrc:      SpO2: 98% 98%    Weight:      Height:        Intake/Output Summary (Last 24 hours) at 08/28/2020 1813 Last data filed at 08/28/2020 0700 Gross per 24 hour  Intake 259.57 ml  Output 250 ml  Net 9.57 ml   Filed Weights   08/27/20 0846 08/28/20 0115  Weight: 70.9 kg 67.1 kg    Examination:  General exam: Appears calm and comfortable, in no major distress, denies chest pain and palpitations.  Patient is afebrile.  Reports feeling weak and deconditioned.   Respiratory system: Good oxygen saturation on room air, no crackles, no wheezing.  Positive scattered rhonchi Cardiovascular system: S1 & S2 heard, rate controlled; no JVD, no rubs, no gallops.   Gastrointestinal system: Abdomen is nondistended, soft and nontender. No organomegaly or masses felt. Normal bowel sounds heard. Central nervous system: Alert and oriented. No focal neurological deficits. Extremities: Cyanosis or clubbing. Skin: No petechiae. Psychiatry: Judgement and insight appear normal. Mood & affect appropriate.     Data Reviewed: I have  personally reviewed following labs and imaging studies  CBC: Recent Labs  Lab 08/27/20 0835 08/28/20 0149  WBC 3.8* 4.8  HGB 13.4 12.5  HCT 42.7 38.9  MCV 92.4 92.0  PLT 261 237    Basic Metabolic Panel: Recent Labs  Lab 08/27/20 0835 08/28/20 0149  NA 141 139  K 3.7 4.1  CL 106 107  CO2 25 22  GLUCOSE 126* 99  BUN 29* 28*  CREATININE 1.20* 1.17*  CALCIUM 8.9 8.5*  MG 2.4  --     GFR: Estimated Creatinine Clearance: 38.6 mL/min (A) (by C-G formula based on SCr of 1.17 mg/dL (H)).  CBG: Recent Labs  Lab 08/27/20 1323 08/27/20 1708 08/28/20 0722 08/28/20 1113 08/28/20 1603  GLUCAP 150* 126* 82 137* 135*     Recent Results (from the past 240 hour(s))  Respiratory Panel by RT PCR (Flu A&B, Covid) - Nasopharyngeal Swab     Status: None   Collection Time: 08/27/20 10:18 AM   Specimen: Nasopharyngeal Swab  Result Value Ref Range Status   SARS Coronavirus 2 by RT PCR NEGATIVE NEGATIVE Final    Comment: (NOTE) SARS-CoV-2 target nucleic acids are NOT DETECTED.  The SARS-CoV-2 RNA is generally detectable  in upper respiratoy specimens during the acute phase of infection. The lowest concentration of SARS-CoV-2 viral copies this assay can detect is 131 copies/mL. A negative result does not preclude SARS-Cov-2 infection and should not be used as the sole basis for treatment or other patient management decisions. A negative result may occur with  improper specimen collection/handling, submission of specimen other than nasopharyngeal swab, presence of viral mutation(s) within the areas targeted by this assay, and inadequate number of viral copies (<131 copies/mL). A negative result must be combined with clinical observations, patient history, and epidemiological information. The expected result is Negative.  Fact Sheet for Patients:  https://www.moore.com/https://www.fda.gov/media/142436/download  Fact Sheet for Healthcare Providers:  https://www.young.biz/https://www.fda.gov/media/142435/download  This  test is no t yet approved or cleared by the [place] FDA and  has been authorized for detection and/or diagnosis of SARS-CoV-2 by FDA under an Emergency Use Authorization (EUA). This EUA will remain  in effect (meaning this test can be used) for the duration of the COVID-19 declaration under Section 564(b)(1) of the Act, 21 U.S.C. section 360bbb-3(b)(1), unless the authorization is terminated or revoked sooner.     Influenza A by PCR NEGATIVE NEGATIVE Final   Influenza B by PCR NEGATIVE NEGATIVE Final    Comment: (NOTE) The Xpert Xpress SARS-CoV-2/FLU/RSV assay is intended as an aid in  the diagnosis of influenza from Nasopharyngeal swab specimens and  should not be used as a sole basis for treatment. Nasal washings and  aspirates are unacceptable for Xpert Xpress SARS-CoV-2/FLU/RSV  testing.  Fact Sheet for Patients: https://www.moore.com/https://www.fda.gov/media/142436/download  Fact Sheet for Healthcare Providers: https://www.young.biz/https://www.fda.gov/media/142435/download  This test is not yet approved or cleared by the [place] FDA and  has been authorized for detection and/or diagnosis of SARS-CoV-2 by  FDA under an Emergency Use Authorization (EUA). This EUA will remain  in effect (meaning this test can be used) for the duration of the  Covid-19 declaration under Section 564(b)(1) of the Act, 21  U.S.C. section 360bbb-3(b)(1), unless the authorization is  terminated or revoked. Performed at [facility], [address] [place], [place] [address]   MRSA PCR Screening     Status: None   Collection Time: 08/28/20  1:06 AM   Specimen: Nasal Mucosa; Nasopharyngeal  Result Value Ref Range Status   MRSA by PCR NEGATIVE NEGATIVE Final    Comment:        The GeneXpert MRSA Assay (FDA approved for NASAL specimens only), is one component of a comprehensive MRSA colonization surveillance program. It is not intended to diagnose MRSA infection nor to guide or monitor treatment for MRSA  infections. Performed at [facility], [address] [place], [place] [address]     [facility]  Result Date: 08/27/2020 CLINICAL DATA:  Chest pain.  Hypertension. EXAM: PORTABLE CHEST 1 VIEW COMPARISON:  None. FINDINGS: No edema or airspace opacity. Heart is mildly enlarged with pulmonary vascularity normal. No adenopathy. There is aortic atherosclerosis. There is degenerative change in each shoulder. Bones are osteoporotic. IMPRESSION: Lungs clear. Mild cardiac enlargement. No adenopathy. Bones osteoporotic. Aortic Atherosclerosis (ICD10-I70.0). Electronically Signed   By: Bretta BangWilliam  Woodruff III M.D.   On: 08/27/2020 09:04   ECHOCARDIOGRAM COMPLETE  Result Date: 08/27/2020    ECHOCARDIOGRAM REPORT   Patient Name:   Marvetta GibbonsGERALDINE Felmlee Date of Exam: 08/27/2020 Medical Rec #:  [id number]        Height:       64.5 in Accession #:    [id number]  Weight:       156.3 lb Date of Birth:  [birth year]         BSA:          1.771 m Patient Age:    74 years         BP:           147/82 mmHg Patient Gender: F                HR:           84 bpm. [facility]am [facility] Procedure: 2D Echo Indications:    Atrial Fibrillation 427.31 / I48.91  History:        Patient has no prior history of Echocardiogram examinations.                 Arrythmias:Atrial Fibrillation, Signs/Symptoms:Chest Pain; Risk                 Factors:Hypertension.  Sonographer:    Jeryl Columbia RDCS (AE) Referring Phys: [id number] Chung Chagoya IMPRESSIONS  1. Left ventricular ejection fraction, by estimation, is 60 to 65%. The left ventricle has normal function. The left ventricle has no regional wall motion abnormalities. There is moderate left ventricular hypertrophy. Left ventricular diastolic parameters are consistent with Grade I diastolic dysfunction (impaired relaxation).  2. Right ventricular systolic function is normal. The right ventricular size is normal.  3. The mitral valve is grossly normal. Trivial mitral  valve regurgitation.  4. The aortic valve is tricuspid. There is mild calcification of the aortic valve. Aortic valve regurgitation is not visualized.  5. The inferior vena cava is normal in size with greater than 50% respiratory variability, suggesting right atrial pressure of 3 mmHg. FINDINGS  Left Ventricle: Left ventricular ejection fraction, by estimation, is 60 to 65%. The left ventricle has normal function. The left ventricle has no regional wall motion abnormalities. The left ventricular internal cavity size was normal in size. There is  moderate left ventricular hypertrophy. Left ventricular diastolic parameters are consistent with Grade I diastolic dysfunction (impaired relaxation). Right Ventricle: The right ventricular size is normal. No increase in right ventricular wall thickness. Right ventricular systolic function is normal. Left Atrium: Left atrial size was normal in size. Right Atrium: Right atrial size was normal in size. Pericardium: There is no evidence of pericardial effusion. Mitral Valve: The mitral valve is grossly normal. Mild to moderate mitral annular calcification. Trivial mitral valve regurgitation. Tricuspid Valve: The tricuspid valve is grossly normal. Tricuspid valve regurgitation is trivial. Aortic Valve: The aortic valve is tricuspid. There is mild calcification of the aortic valve. There is mild aortic valve annular calcification. Aortic valve regurgitation is not visualized. Pulmonic Valve: The pulmonic valve was grossly normal. Pulmonic valve regurgitation is trivial. Aorta: The aortic root is normal in size and structure. Venous: The inferior vena cava is normal in size with greater than 50% respiratory variability, suggesting right atrial pressure of 3 mmHg. IAS/Shunts: The interatrial septum appears to be lipomatous. No atrial level shunt detected by color flow Doppler.  LEFT VENTRICLE PLAX 2D LVIDd:         3.32 cm  Diastology LVIDs:         1.76 cm  LV e' medial:    4.13  cm/s LV PW:         1.39 cm  LV E/e' medial:  12.3 LV IVS:        1.32 cm  LV e' lateral:   6.85 cm/s  LVOT diam:     1.80 cm  LV E/e' lateral: 7.4 LVOT Area:     2.54 cm  RIGHT VENTRICLE RV S prime:     11.60 cm/s TAPSE (M-mode): 2.6 cm LEFT ATRIUM             Index       RIGHT ATRIUM           Index LA diam:        3.50 cm 1.98 cm/m  RA Area:     12.50 cm LA Vol (A2C):   28.1 ml 15.86 ml/m RA Volume:   31.50 ml  17.78 ml/m LA Vol (A4C):   29.0 ml 16.37 ml/m LA Biplane Vol: 28.5 ml 16.09 ml/m   AORTA Ao Root diam: 2.80 cm MITRAL VALVE               TRICUSPID VALVE MV Area (PHT): 2.22 cm    TR Peak grad:   13.4 mmHg MV Decel Time: 342 msec    TR Vmax:        183.00 cm/s MV E velocity: 50.60 cm/s MV A velocity: 75.80 cm/s  SHUNTS MV E/A ratio:  0.67        Systemic Diam: 1.80 cm Nona Dell MD Electronically signed by Nona Dell MD Signature Date/Time: 08/27/2020/3:58:09 PM    Final     Scheduled Meds: . apixaban  5 mg Oral BID  . Chlorhexidine Gluconate Cloth  6 each Topical Daily  . diltiazem  60 mg Oral Q12H  . DULoxetine  30 mg Oral Daily  . insulin aspart  0-9 Units Subcutaneous TID WC  . insulin detemir  5 Units Subcutaneous QHS  . metoprolol tartrate  50 mg Oral BID  . pantoprazole  40 mg Oral Daily   Continuous Infusions:   LOS: 1 day    Time spent: 35 minutes.   Vassie Loll, MD [facility]   To contact the attending provider between 7A-7P or the covering provider during after hours 7P-7A, please log into the web site [email or web address] and access using universal [place] password for that web site. If you do not have the password, please call the hospital operator.  08/28/2020, 6:13 PM

## 2020-08-28 NOTE — TOC Progression Note (Signed)
TOC received consult for Eliquis coupon for dc. Have discussed with Pharmacist, Jeannett Senior, who will provide the coupon and education to patient. Will clear this consult.

## 2020-08-28 NOTE — Discharge Instructions (Signed)

## 2020-08-28 NOTE — Care Management Important Message (Signed)
Important Message  Patient Details  Name: Felicia Randolph MRN: [id number] Date of Birth: Mar 29, 1946   Medicare Important Message Given:  Yes     Corey Harold 08/28/2020, 3:31 PM

## 2020-08-28 NOTE — Consult Note (Addendum)
Cardiology Consult    Patient ID: Zanya Lindo; [id number]; [birth year]   Admit date: 08/27/2020 Date of Consult: 08/28/2020  Primary Care Provider: Pcp, No Primary Cardiologist: New to [facility] - Dr. Tenny Craw  Patient Profile    Felicia Randolph is a 74 y.o. female with past medical history of HTN, Type 2 DM and GERD who is being seen today for the evaluation of new-onset atrial fibrillation at the request of Dr. Gwenlyn Perking.   History of Present Illness    [facility] presented to [facility] on 08/27/2020 for evaluation of chest pain and was found to be in new-onset atrial fibrillation with RVR. She reports being in her usual state of health until yesterday when she developed "congestion" in her chest. Reports similar symptoms in the past but they would spontaneously resolve. Denies any associated dyspnea, nausea, vomiting or diaphoresis. No recent orthopnea, PND or edema.   Initial labs show WBC 3.8, Hgb 13.4, platelets 261, Na+ 141, K+ 3.7 and creatinine 1.20. TSH 1.937. Mg 2.4. BNP 67. Initial HS Troponin 25 with repeat of 150. COVID negative. FLP shows total cholesterol of 181, HDL 59 and LDL 107. CXR shows no acute cardiopulmonary abnormalities. Noted to have aortic atherosclerosis. EKG showed atrial fibrillation with RVR, HR 138.  She was started on IV Cardizem and IV Heparin while in the ED along with being restarted on PTA Lopressor 50mg  BID. She did convert to NSR yesterday afternoon. An echocardiogram was obtained and shows a preserved EF of 60-65% with moderate LVH and Grade 1 DD. Normal RV function and trivial MR but no significant valve abnormalities.   Denies any symptoms this morning. Feels back to baseline. Has maintained NSR with HR in the 60's to 70's.   Past Medical History:  Diagnosis Date  . DM (diabetes mellitus), type 2 (HCC)   . Hypertension     History reviewed. No pertinent surgical history.   Home Medications:  Prior to Admission medications   Medication Sig  Start Date End Date Taking? Authorizing Provider  Aspirin-Salicylamide-Caffeine (BC FAST PAIN RELIEF) 650-195-33.3 MG PACK Take 1 packet by mouth as needed.   Yes [provider]  baclofen (LIORESAL) 10 MG tablet Take 5 mg by mouth 2 (two) times daily as needed. Patient not taking: Reported on 08/27/2020    [provider]  Cholecalciferol 50 MCG (2000 UT) TABS Take 2,000 Units by mouth daily. Patient not taking: Reported on 08/27/2020 08/05/15   [provider]  DULoxetine (CYMBALTA) 60 MG capsule Take 60 mg by mouth daily. Patient not taking: Reported on 08/27/2020 08/05/15   [provider]  metFORMIN (GLUCOPHAGE) 500 MG tablet Take 500 mg by mouth daily. Patient not taking: Reported on 08/27/2020 08/05/15   [provider]  metoprolol tartrate (LOPRESSOR) 50 MG tablet Take 50 mg by mouth in the morning and at bedtime. Patient not taking: Reported on 08/27/2020    [provider]  omeprazole (PRILOSEC) 20 MG capsule Take 20 mg by mouth daily. Patient not taking: Reported on 08/27/2020    [provider]    Inpatient Medications: Scheduled Meds: . apixaban  5 mg Oral BID  . Chlorhexidine Gluconate Cloth  6 each Topical Daily  . diltiazem  60 mg Oral Q12H  . DULoxetine  30 mg Oral Daily  . insulin aspart  0-9 Units Subcutaneous TID WC  . insulin detemir  5 Units Subcutaneous QHS  . metoprolol tartrate  50 mg Oral BID  . pantoprazole  40 mg Oral Daily   Continuous Infusions:  PRN Meds: acetaminophen, ALPRAZolam, ondansetron (ZOFRAN) IV, ondansetron **OR** ondansetron (ZOFRAN) IV  Allergies:    Allergies  Allergen Reactions  . Ace Inhibitors Other (See Comments)  . Hydrocodone-Acetaminophen Anxiety  . Oxycodone Anxiety    agitation and confusion    Social History:   Social History   Socioeconomic History  . Marital status: Widowed    Spouse name: Not on file  . Number of children: Not on file  . Years of  education: Not on file  . Highest education level: Not on file  Occupational History  . Not on file  Tobacco Use  . Smoking status: Current Every Day Smoker    Packs/day: 0.50  . Smokeless tobacco: Never Used  Substance and Sexual Activity  . Alcohol use: Yes    Comment: occ  . Drug use: Never  . Sexual activity: Not on file  Other Topics Concern  . Not on file  Social History Narrative  . Not on file   Social Determinants of Health   Financial Resource Strain:   . Difficulty of Paying Living Expenses: Not on file  Food Insecurity:   . Worried About Programme researcher, broadcasting/film/videounning Out of Food in the Last Year: Not on file  . Ran Out of Food in the Last Year: Not on file  Transportation Needs:   . Lack of Transportation (Medical): Not on file  . Lack of Transportation (Non-Medical): Not on file  Physical Activity:   . Days of Exercise per Week: Not on file  . Minutes of Exercise per Session: Not on file  Stress:   . Feeling of Stress : Not on file  Social Connections:   . Frequency of Communication with Friends and Family: Not on file  . Frequency of Social Gatherings with Friends and Family: Not on file  . Attends Religious Services: Not on file  . Active Member of Clubs or Organizations: Not on file  . Attends BankerClub or Organization Meetings: Not on file  . Marital Status: Not on file  Intimate Partner Violence:   . Fear of Current or Ex-Partner: Not on file  . Emotionally Abused: Not on file  . Physically Abused: Not on file  . Sexually Abused: Not on file     Family History:    Family History  Problem Relation Age of Onset  . Diabetes Father       Review of Systems    General:  No chills, fever, night sweats or weight changes.  Cardiovascular:  No dyspnea on exertion, edema, orthopnea, palpitations, paroxysmal nocturnal dyspnea. Positive for chest pain.  Dermatological: No rash, lesions/masses Respiratory: No cough, dyspnea Urologic: No hematuria, dysuria Abdominal:   No  nausea, vomiting, diarrhea, bright red blood per rectum, melena, or hematemesis Neurologic:  No visual changes, wkns, changes in mental status. All other systems reviewed and are otherwise negative except as noted above.  Physical Exam/Data    Vitals:   08/28/20 0400 08/28/20 0500 08/28/20 0600 08/28/20 0814  BP: (!) 188/98 (!) 173/93 (!) 174/89   Pulse: (!) 53 (!) 56 (!) 56   Resp: 14 12 13    Temp:    98.8 F (37.1 C)  TempSrc:      SpO2: 98% 98% 98%   Weight:      Height:        Intake/Output Summary (Last 24 hours) at 08/28/2020 1019 Last data filed at 08/28/2020 0700 Gross per 24 hour  Intake 780.94 ml  Output 250 ml  Net 530.94 ml   Filed Weights   08/27/20 0846 08/28/20 0115  Weight: 70.9 kg 67.1 kg   Body mass index is 25.39 kg/m.   General: Pleasant elderly female appearing in NAD Psych: Normal affect. Neuro: Alert and oriented X 2 (person, place). Moves all extremities spontaneously. HEENT: Normal  Neck: Supple without bruits or JVD. Lungs:  Resp regular and unlabored, CTA without wheezing or rales. Heart: RRR no s3, s4, or murmurs. Abdomen: Soft, non-tender, non-distended, BS + x 4.  Extremities: No clubbing, cyanosis or lower extremity edema. DP/PT/Radials 2+ and equal bilaterally.   EKG:  The EKG was personally reviewed and demonstrates: Atrial fibrillation with RVR, HR 138.  Telemetry:  Telemetry was personally reviewed and demonstrates: NSR, HR in 60's to 70's. Episode of VT overnight lasting 2.3 seconds. Occasional PVC's.    Labs/Studies     Relevant CV Studies:  Echocardiogram: 08/27/2020 IMPRESSIONS    1. Left ventricular ejection fraction, by estimation, is 60 to 65%. The  left ventricle has normal function. The left ventricle has no regional  wall motion abnormalities. There is moderate left ventricular hypertrophy.  Left ventricular diastolic  parameters are consistent with Grade I diastolic dysfunction (impaired  relaxation).  2.  Right ventricular systolic function is normal. The right ventricular  size is normal.  3. The mitral valve is grossly normal. Trivial mitral valve  regurgitation.  4. The aortic valve is tricuspid. There is mild calcification of the  aortic valve. Aortic valve regurgitation is not visualized.  5. The inferior vena cava is normal in size with greater than 50%  respiratory variability, suggesting right atrial pressure of 3 mmHg.   Laboratory Data:  Chemistry Recent Labs  Lab 08/27/20 0835 08/28/20 0149  NA 141 139  K 3.7 4.1  CL 106 107  CO2 25 22  GLUCOSE 126* 99  BUN 29* 28*  CREATININE 1.20* 1.17*  CALCIUM 8.9 8.5*  GFRNONAA 44* 45*  ANIONGAP 10 10    No results for input(s): PROT, ALBUMIN, AST, ALT, ALKPHOS, BILITOT in the last 168 hours. Hematology Recent Labs  Lab 08/27/20 0835 08/28/20 0149  WBC 3.8* 4.8  RBC 4.62 4.23  HGB 13.4 12.5  HCT 42.7 38.9  MCV 92.4 92.0  MCH 29.0 29.6  MCHC 31.4 32.1  RDW 14.3 14.5  PLT 261 237   Cardiac EnzymesNo results for input(s): TROPONINI in the last 168 hours. No results for input(s): TROPIPOC in the last 168 hours.  BNP Recent Labs  Lab 08/27/20 0835  BNP 67.0    DDimer No results for input(s): DDIMER in the last 168 hours.  Radiology/Studies:  DG Chest Port 1 View  Result Date: 08/27/2020 CLINICAL DATA:  Chest pain.  Hypertension. EXAM: PORTABLE CHEST 1 VIEW COMPARISON:  None. FINDINGS: No edema or airspace opacity. Heart is mildly enlarged with pulmonary vascularity normal. No adenopathy. There is aortic atherosclerosis. There is degenerative change in each shoulder. Bones are osteoporotic. IMPRESSION: Lungs clear. Mild cardiac enlargement. No adenopathy. Bones osteoporotic. Aortic Atherosclerosis (ICD10-I70.0). Electronically Signed   By: Bretta Bang III M.D.   On: 08/27/2020 09:04    Assessment & Plan    1. Paroxysmal Atrial Fibrillation - Presented with "chest congestion" and was found to be in atrial  fibrillation while in the ED. Was started on IV Cardizem and converted back to NSR yesterday afternoon and has maintained NSR since. By review of Care Everywhere, she has a history of paroxysmal atrial fibrillation documented  previously in 2016 so it does not appear this is new.  - Labs show Na+ 141, K+ 3.7, TSH 1.937. Mg 2.4. Hs Troponin was elevated to 25 and 150 but suspect this is most consistent with demand ischemia in the setting of atrial fibrillation with RVR and her symptoms resolved once back in NSR. Echo shows a preserved EF of 60-65% with moderate LVH and Grade 1 DD. Normal RV function and trivial MR but no significant valve abnormalities.  - Ideally, she should not require two AV nodal blocking agents but she is unsure if she was taking Lopressor 50mg  BID as an outpatient. If she was not taking this, could likely just discharge on Lopressor alone. If taking as an outpatient, can send out on Cardizem CD 120mg  daily and Lopressor 50mg  BID and readdress HR/BP as an outpatient.  - This patients CHA2DS2-VASc Score and unadjusted Ischemic Stroke Rate (% per year) is equal to 9.7 % stroke rate/year from a score of 6 (HTN, DM, Vascular, Female, Age (2)). Will stop Heparin and switch to Eliquis 5mg  BID for anticoagulation. Will consult Case Management for 30-day card.   2. NSVT - Brief episode overnight. She does have a mild tremor which impacts telemetry but QRS appears most consistent with VT at that time. K+ 4.1 and Mg 2.4. Continue Lopressor 50mg  BID.   3. HTN - SBP recorded in the 190's upon entering the room and rechecked with SBP in the 160's. Listed as being on Lopressor 50mg  BID as an outpatient but she says she was not on any medications as an outpatient. Would recommend confirming with the patient's daughter prior to discharge. She is being restarted on this and is also currently on PO Cardizem. Continue to follow as an outpatient.   3. Memory Loss - She is A&Ox2 during today's encounter.  Confirmed with patient's nurse this has been her baseline since admission. She is unsure of the month of year. Says she lives with her daughter.    For questions or updates, please contact [facility] Please consult [email or web address] for contact info under Cardiology/STEMI.  Signed, , PA-C 08/28/2020, 10:19 AM Pager: [phone]  Patient seen and examined  I agree with findings as noted by B Strader above Pt is a 74 yo who has hx of HTN, DM, atherosclerosis on CT and PAF in past (2016)  Presented with congestion  Found to be in afib with RVR   She converted to SR Resting comfortably now Lungs are with mild rhonchi Cardiac exam:  RRR  No significnat murmurs  A Abd is benign Ext arewithout edema  I would keep on b blocker and Diltiazem CD   Pt needs anticoagulation as CHADSVASC is 6.  Will make sure pt has f/u as outpt.   OK to d/c from cardiac standpoint   MD

## 2020-08-28 NOTE — Progress Notes (Signed)
ANTICOAGULATION CONSULT NOTE   Pharmacy Consult for heparin gtt  Indication: chest pain/ACS  Allergies  Allergen Reactions  . Ace Inhibitors Other (See Comments)  . Hydrocodone-Acetaminophen Anxiety  . Oxycodone Anxiety    agitation and confusion    Patient Measurements: Height: 5\' 4"  (162.6 cm) Weight: 67.1 kg (147 lb 14.9 oz) IBW/kg (Calculated) : 54.7 Heparin Dosing Weight: HEPARIN DW (KG): 67.1   Vital Signs: Temp: 98.2 F (36.8 C) (10/15 0115) Temp Source: Oral (10/15 0115) BP: 181/94 (10/15 0030) Pulse Rate: 49 (10/15 0030)  Labs: Recent Labs    08/27/20 0835 08/27/20 1624 08/28/20 0149  HGB 13.4  --  12.5  HCT 42.7  --  38.9  PLT 261  --  237  LABPROT  --   --  13.7  INR  --   --  1.1  HEPARINUNFRC  --  0.94* 0.75*  CREATININE 1.20*  --   --     Estimated Creatinine Clearance: 37.6 mL/min (A) (by C-G formula based on SCr of 1.2 mg/dL (H)).  Assessment: Felicia Randolph a 74 y.o. female requires anticoagulation with a heparin iv infusion for the indication of  chest pain/ACS. Heparin gtt will be started following pharmacy protocol per pharmacy consult.   Heparin level slightly supratherapeutic (0.75) on gtt at 1000 units/hr. No bleeding noted.  Goal of Therapy:  Heparin level 0.3-0.7 units/ml Monitor platelets by anticoagulation protocol: Yes   Plan:  Decrease heparin infusion to 900 units/hr Check anti-Xa level in 8 hours  73, PharmD, BCPS Please see amion for complete clinical pharmacist phone list 08/28/2020 2:18 AM

## 2020-08-29 DIAGNOSIS — N1832 Chronic kidney disease, stage 3b: Secondary | ICD-10-CM

## 2020-08-29 DIAGNOSIS — F418 Other specified anxiety disorders: Secondary | ICD-10-CM

## 2020-08-29 DIAGNOSIS — I10 Essential (primary) hypertension: Secondary | ICD-10-CM

## 2020-08-29 DIAGNOSIS — R072 Precordial pain: Secondary | ICD-10-CM

## 2020-08-29 DIAGNOSIS — K219 Gastro-esophageal reflux disease without esophagitis: Secondary | ICD-10-CM

## 2020-08-29 DIAGNOSIS — E1121 Type 2 diabetes mellitus with diabetic nephropathy: Secondary | ICD-10-CM

## 2020-08-29 LAB — GLUCOSE, CAPILLARY
Glucose-Capillary: 82 mg/dL (ref 70–99)
Glucose-Capillary: 127 mg/dL — ABNORMAL HIGH (ref 70–99)
Glucose-Capillary: 110 mg/dL — ABNORMAL HIGH (ref 70–99)

## 2020-08-29 MED ORDER — DILTIAZEM HCL ER COATED BEADS 120 MG PO CP24: 120.0000 mg | ORAL_CAPSULE | Freq: Every day | ORAL | 3 refills | Status: AC

## 2020-08-29 MED ORDER — METFORMIN HCL 500 MG PO TABS: 500.0000 mg | ORAL_TABLET | Freq: Every day | ORAL | 1 refills | Status: AC

## 2020-08-29 MED ORDER — CHOLECALCIFEROL 50 MCG (2000 UT) PO TABS: 2000.0000 [IU] | ORAL_TABLET | Freq: Every day | ORAL | 1 refills | Status: AC

## 2020-08-29 MED ORDER — APIXABAN 5 MG PO TABS: 5.0000 mg | ORAL_TABLET | Freq: Two times a day (BID) | ORAL | 2 refills | Status: AC

## 2020-08-29 MED ORDER — OMEPRAZOLE 20 MG PO CPDR: 20.0000 mg | DELAYED_RELEASE_CAPSULE | Freq: Every day | ORAL | 1 refills | Status: AC

## 2020-08-29 MED ORDER — DULOXETINE HCL 30 MG PO CPEP: 30.0000 mg | ORAL_CAPSULE | Freq: Every day | ORAL | 1 refills | Status: AC

## 2020-08-29 MED ORDER — METOPROLOL TARTRATE 50 MG PO TABS: 50.0000 mg | ORAL_TABLET | Freq: Two times a day (BID) | ORAL | 1 refills | Status: AC

## 2020-08-29 NOTE — Progress Notes (Signed)
Nsg Discharge Note  Admit Date:  08/27/2020 Discharge date: 08/29/2020   Daija Routson to be D/C'd Home per MD order.  AVS completed.  Copy for chart, and copy for patient signed, and dated. Removed IVs-clean, dry, intact. Wheeled stable patient and belongings to main entrance where she was picked up by her daughter to d/c to home. I reviewed d/c paperwork with daughter. Patient/caregiver able to verbalize understanding.  Discharge Medication: Allergies as of 08/29/2020      Reactions   Ace Inhibitors Other (See Comments)   Hydrocodone-acetaminophen Anxiety   Oxycodone Anxiety   agitation and confusion      Medication List    STOP taking these medications   baclofen 10 MG tablet Commonly known as: LIORESAL   BC Fast Pain Relief 650-195-33.3 MG Pack Generic drug: Aspirin-Salicylamide-Caffeine     TAKE these medications   apixaban 5 MG Tabs tablet Commonly known as: ELIQUIS Take 1 tablet (5 mg total) by mouth 2 (two) times daily.   Cholecalciferol 50 MCG (2000 UT) Tabs Take 1 tablet (2,000 Units total) by mouth daily.   diltiazem 120 MG 24 hr capsule Commonly known as: Cardizem CD Take 1 capsule (120 mg total) by mouth daily.   DULoxetine 30 MG capsule Commonly known as: CYMBALTA Take 1 capsule (30 mg total) by mouth daily. Start taking on: August 30, 2020 What changed:   medication strength  how much to take   metFORMIN 500 MG tablet Commonly known as: GLUCOPHAGE Take 1 tablet (500 mg total) by mouth daily.   metoprolol tartrate 50 MG tablet Commonly known as: LOPRESSOR Take 1 tablet (50 mg total) by mouth 2 (two) times daily. What changed: when to take this   omeprazole 20 MG capsule Commonly known as: PRILOSEC Take 1 capsule (20 mg total) by mouth daily.       Discharge Assessment: Vitals:   08/29/20 0925 08/29/20 1406  BP:  (!) 162/79  Pulse: 61 (!) 57  Resp:  16  Temp:  99.5 F (37.5 C)  SpO2:  100%   Skin clean, dry and intact without  evidence of skin break down, no evidence of skin tears noted. IV catheter discontinued intact. Site without signs and symptoms of complications - no redness or edema noted at insertion site, patient denies c/o pain - only slight tenderness at site.  Dressing with slight pressure applied.  D/c Instructions-Education: Discharge instructions given to patient/family with verbalized understanding. D/c education completed with patient/family including follow up instructions, medication list, d/c activities limitations if indicated, with other d/c instructions as indicated by MD - patient able to verbalize understanding, all questions fully answered. Patient instructed to return to ED, call 911, or call MD for any changes in condition.  Patient escorted via WC, and D/C home via private auto.  Karolee Ohs, RN 08/29/2020 7:43 PM

## 2020-08-29 NOTE — Plan of Care (Signed)

## 2020-08-29 NOTE — Discharge Summary (Signed)
Physician Discharge Summary  Felicia Randolph CZY:[id number] DOB: 1946-03-11 DOA: 08/27/2020  PCP: Pcp, No  Admit date: 08/27/2020 Discharge date: 08/29/2020  Time spent: 35 minutes  Recommendations for Outpatient Follow-up:  Reassess blood pressure and adjust antihypertensive treatment as needed. Repeat basic metabolic panel to follow trazodone avoid. Continue to follow CBG closely with further adjustment to hypoglycemic regimen as required.  Discharge Diagnoses:  Active Problems:   Atrial fibrillation with RVR (HCC)   Precordial chest pain   Type 2 diabetes with nephropathy (HCC)   Primary hypertension   Chronic renal failure, stage 3b (HCC)   Gastroesophageal reflux disease   Depression with anxiety   Discharge Condition: Stable and improved.  Discharged home with instruction to follow-up with cardiology service/A. Fib clinic as an outpatient and with PCP in 10 days.  Code status: full code.  Diet recommendation: Heart healthy modified carbohydrate diet.  Filed Weights   08/27/20 0846 08/28/20 0115 08/29/20 0500  Weight: 70.9 kg 67.1 kg 67 kg    History of present illness:  Felicia Princeis a 74 y.o.femalewith medical history significant ofhypertension, gastroesophageal reflux disease, type 2 diabetes and depression; who presented to the hospital secondary to chest discomfort and shortness of breath.Patient reports symptoms have been present on and off for the last for 5 days but is slightly more severe on the morning of presentation; pain is localized in the medial left side aspectof her chest,nonradiating, 6 out of 10 in intensity and lasting over 15-20 minutes. Patient reports no fever, no coughing, no shortness of breath, no nausea, no vomiting, no abdominal pain, no hematuria, no dysuria, no sick contacts or any other complaints.  She reports no close follow-up with her primary care doctor and recently some medication noncompliance.  ED Course:Work-up  demonstrated patient to be in A. fib with RVR on presentation, mild elevation in her troponin and improvement in her symptoms after controlling rate with Cardizem drip. TRH has been contacted to place patient in hospital for further evaluation and management. Cardiology was also consulted due to mild elevation in her troponin. Chest x-ray demonstrates no acute infiltrates orcardiopulmonary process.  Hospital Course:  1-chest pain/A. fib with RVR -Currently chest pain-free and denies palpitations or any other complaints. -Patient converted throughout the night after receiving Cardizem drip and metoprolol -2D echo reassuring and per cardiology's recommendations for discharge on Eliquis 5 mg twice a day. -Will discharge home on Cardizem 120 mg CD on daily basis and also metoprolol 50 mg twice a day. -Patient will follow up with cardiology service in the outpatient atrial fibrillation clinic.  2-hypertension -Fair control -Patient will be discharged on Cardizem and metoprolol -Advised to follow heart healthy diet -Follow vital signs with further adjustment to antihypertensive regimen as required.  3-type 2 diabetes with nephropathy -A1c 6.0 -Continue modified carbohydrate diet resume home hypoglycemic regimen. -Outpatient follow-up with PCP to further adjust hypoglycemic regimen as required.  4-stage IIIb chronic kidney disease -Appears to be stable and overall at baseline for her. -Advised to maintain adequate hydration -Repeat basic metabolic panel follow-up visit to reassess renal function and stability.  5-GERD -continue PPI  6-depression/anxiety -No suicidal ideation hallucination -Mood is a stable -Will continue treatment with Cymbalta.   Procedures: See below for x-ray report 2D echo: 1. Left ventricular ejection fraction, by estimation, is 60 to 65%. The  left ventricle has normal function. The left ventricle has no regional  wall motion abnormalities. There is  moderate left ventricular hypertrophy.  Left ventricular diastolic  parameters  are consistent with Grade I diastolic dysfunction (impaired  relaxation).  2. Right ventricular systolic function is normal. The right ventricular  size is normal.  3. The mitral valve is grossly normal. Trivial mitral valve  regurgitation.  4. The aortic valve is tricuspid. There is mild calcification of the  aortic valve. Aortic valve regurgitation is not visualized.  5. The inferior vena cava is normal in size with greater than 50%  respiratory variability, suggesting right atrial pressure of 3 mmHg.   Consultations:  Cardiology service  Discharge Exam: Vitals:   08/29/20 0500 08/29/20 0925  BP: (!) 160/70   Pulse: (!) 52 61  Resp: 13   Temp:    SpO2: 94%     General: Afebrile, no chest pain, no nausea, no vomiting.  Denies palpitation and expressed feeling ready to go home. Cardiovascular: S1 and S2, no rubs, no gallops, no JVD.  Sinus rhythm regular rate. Respiratory: Clear to auscultation bilaterally; no using accessory muscles. Abdomen: Soft, nontender, distended, positive bowel sounds Extremities: No cyanosis or clubbing.  Discharge Instructions   Discharge Instructions    Amb referral to AFIB Clinic   Complete by: As directed    Diet - low sodium heart healthy   Complete by: As directed    Diet Carb Modified   Complete by: As directed    Discharge instructions   Complete by: As directed    The medications are prescribed Maintain adequate hydration Follow heart healthy/modified carbohydrate diet Arrange follow-up with PCP in 10 days Follow-up with cardiology service as instructed Avoid the use of NSAIDs (aspirin, Excedrin, Goody powders, ibuprofen, Aleve, Advil or any over-the-counter medications that can increase the chance of bleeding while taking Eliquis).     Allergies as of 08/29/2020      Reactions   Ace Inhibitors Other (See Comments)   Hydrocodone-acetaminophen  Anxiety   Oxycodone Anxiety   agitation and confusion      Medication List    STOP taking these medications   baclofen 10 MG tablet Commonly known as: LIORESAL   BC Fast Pain Relief 650-195-33.3 MG Pack Generic drug: Aspirin-Salicylamide-Caffeine     TAKE these medications   apixaban 5 MG Tabs tablet Commonly known as: ELIQUIS Take 1 tablet (5 mg total) by mouth 2 (two) times daily.   Cholecalciferol 50 MCG (2000 UT) Tabs Take 1 tablet (2,000 Units total) by mouth daily.   diltiazem 120 MG 24 hr capsule Commonly known as: Cardizem CD Take 1 capsule (120 mg total) by mouth daily.   DULoxetine 30 MG capsule Commonly known as: CYMBALTA Take 1 capsule (30 mg total) by mouth daily. Start taking on: August 30, 2020 What changed:   medication strength  how much to take   metFORMIN 500 MG tablet Commonly known as: GLUCOPHAGE Take 1 tablet (500 mg total) by mouth daily.   metoprolol tartrate 50 MG tablet Commonly known as: LOPRESSOR Take 1 tablet (50 mg total) by mouth 2 (two) times daily. What changed: when to take this   omeprazole 20 MG capsule Commonly known as: PRILOSEC Take 1 capsule (20 mg total) by mouth daily.      Allergies  Allergen Reactions  . Ace Inhibitors Other (See Comments)  . Hydrocodone-Acetaminophen Anxiety  . Oxycodone Anxiety    agitation and confusion    Follow-up Information    Abelino Derrick, PA-C Follow up on 09/08/2020.   Specialties: Cardiology, Radiology Why: Cardiology Hospital Follow-up on 09/08/2020 at 11:45 AM.  Contact information: [address] [place] [address] [phone]               The results of significant diagnostics from this hospitalization (including imaging, microbiology, ancillary and laboratory) are listed below for reference.    Significant Diagnostic Studies: DG Chest Port 1 View  Result Date: 08/27/2020 CLINICAL DATA:  Chest pain.  Hypertension. EXAM: PORTABLE CHEST 1 VIEW COMPARISON:   None. FINDINGS: No edema or airspace opacity. Heart is mildly enlarged with pulmonary vascularity normal. No adenopathy. There is aortic atherosclerosis. There is degenerative change in each shoulder. Bones are osteoporotic. IMPRESSION: Lungs clear. Mild cardiac enlargement. No adenopathy. Bones osteoporotic. Aortic Atherosclerosis (ICD10-I70.0). Electronically Signed   By: Bretta Bang III M.D.   On: 08/27/2020 09:04   ECHOCARDIOGRAM COMPLETE  Result Date: 08/27/2020    ECHOCARDIOGRAM REPORT   Patient Name:   KAELEI WHEELER Date of Exam: 08/27/2020 Medical Rec #:  [id number]        Height:       64.5 in Accession #:    [id number]       Weight:       156.3 lb Date of Birth:  Mar 16, 1946         BSA:          1.771 m Patient Age:    74 years         BP:           147/82 mmHg Patient Gender: F                HR:           84 bpm. Exam Location:  [facility] Procedure: 2D Echo Indications:    Atrial Fibrillation 427.31 / I48.91  History:        Patient has no prior history of Echocardiogram examinations.                 Arrythmias:Atrial Fibrillation, Signs/Symptoms:Chest Pain; Risk                 Factors:Hypertension.  Sonographer:    Jeryl Columbia RDCS (AE) Referring Phys: [id number] Xaine Sansom IMPRESSIONS  1. Left ventricular ejection fraction, by estimation, is 60 to 65%. The left ventricle has normal function. The left ventricle has no regional wall motion abnormalities. There is moderate left ventricular hypertrophy. Left ventricular diastolic parameters are consistent with Grade I diastolic dysfunction (impaired relaxation).  2. Right ventricular systolic function is normal. The right ventricular size is normal.  3. The mitral valve is grossly normal. Trivial mitral valve regurgitation.  4. The aortic valve is tricuspid. There is mild calcification of the aortic valve. Aortic valve regurgitation is not visualized.  5. The inferior vena cava is normal in size with greater than 50% respiratory variability,  suggesting right atrial pressure of 3 mmHg. FINDINGS  Left Ventricle: Left ventricular ejection fraction, by estimation, is 60 to 65%. The left ventricle has normal function. The left ventricle has no regional wall motion abnormalities. The left ventricular internal cavity size was normal in size. There is  moderate left ventricular hypertrophy. Left ventricular diastolic parameters are consistent with Grade I diastolic dysfunction (impaired relaxation). Right Ventricle: The right ventricular size is normal. No increase in right ventricular wall thickness. Right ventricular systolic function is normal. Left Atrium: Left atrial size was normal in size. Right Atrium: Right atrial size was normal in size. Pericardium: There is no evidence of pericardial effusion. Mitral Valve: The mitral valve is grossly normal. Mild to  moderate mitral annular calcification. Trivial mitral valve regurgitation. Tricuspid Valve: The tricuspid valve is grossly normal. Tricuspid valve regurgitation is trivial. Aortic Valve: The aortic valve is tricuspid. There is mild calcification of the aortic valve. There is mild aortic valve annular calcification. Aortic valve regurgitation is not visualized. Pulmonic Valve: The pulmonic valve was grossly normal. Pulmonic valve regurgitation is trivial. Aorta: The aortic root is normal in size and structure. Venous: The inferior vena cava is normal in size with greater than 50% respiratory variability, suggesting right atrial pressure of 3 mmHg. IAS/Shunts: The interatrial septum appears to be lipomatous. No atrial level shunt detected by color flow Doppler.  LEFT VENTRICLE PLAX 2D LVIDd:         3.32 cm  Diastology LVIDs:         1.76 cm  LV e' medial:    4.13 cm/s LV PW:         1.39 cm  LV E/e' medial:  12.3 LV IVS:        1.32 cm  LV e' lateral:   6.85 cm/s LVOT diam:     1.80 cm  LV E/e' lateral: 7.4 LVOT Area:     2.54 cm  RIGHT VENTRICLE RV S prime:     11.60 cm/s TAPSE (M-mode): 2.6 cm LEFT  ATRIUM             Index       RIGHT ATRIUM           Index LA diam:        3.50 cm 1.98 cm/m  RA Area:     12.50 cm LA Vol (A2C):   28.1 ml 15.86 ml/m RA Volume:   31.50 ml  17.78 ml/m LA Vol (A4C):   29.0 ml 16.37 ml/m LA Biplane Vol: 28.5 ml 16.09 ml/m   AORTA Ao Root diam: 2.80 cm MITRAL VALVE               TRICUSPID VALVE MV Area (PHT): 2.22 cm    TR Peak grad:   13.4 mmHg MV Decel Time: 342 msec    TR Vmax:        183.00 cm/s MV E velocity: 50.60 cm/s MV A velocity: 75.80 cm/s  SHUNTS MV E/A ratio:  0.67        Systemic Diam: 1.80 cm Nona DellSamuel Mcdowell MD Electronically signed by Nona DellSamuel Mcdowell MD Signature Date/Time: 08/27/2020/3:58:09 PM    Final     Microbiology: Recent Results (from the past 240 hour(s))  Respiratory Panel by RT PCR (Flu A&B, Covid) - Nasopharyngeal Swab     Status: None   Collection Time: 08/27/20 10:18 AM   Specimen: Nasopharyngeal Swab  Result Value Ref Range Status   SARS Coronavirus 2 by RT PCR NEGATIVE NEGATIVE Final    Comment: (NOTE) SARS-CoV-2 target nucleic acids are NOT DETECTED.  The SARS-CoV-2 RNA is generally detectable in upper respiratoy specimens during the acute phase of infection. The lowest concentration of SARS-CoV-2 viral copies this assay can detect is 131 copies/mL. A negative result does not preclude SARS-Cov-2 infection and should not be used as the sole basis for treatment or other patient management decisions. A negative result may occur with  improper specimen collection/handling, submission of specimen other than nasopharyngeal swab, presence of viral mutation(s) within the areas targeted by this assay, and inadequate number of viral copies (<131 copies/mL). A negative result must be combined with clinical observations, patient history, and epidemiological information. The expected result is Negative.  Fact Sheet for Patients:  https://www.moore.com/  Fact Sheet for Healthcare Providers:   https://www.young.biz/  This test is no t yet approved or cleared by the [place] FDA and  has been authorized for detection and/or diagnosis of SARS-CoV-2 by FDA under an Emergency Use Authorization (EUA). This EUA will remain  in effect (meaning this test can be used) for the duration of the COVID-19 declaration under Section 564(b)(1) of the Act, 21 U.S.C. section 360bbb-3(b)(1), unless the authorization is terminated or revoked sooner.     Influenza A by PCR NEGATIVE NEGATIVE Final   Influenza B by PCR NEGATIVE NEGATIVE Final    Comment: (NOTE) The Xpert Xpress SARS-CoV-2/FLU/RSV assay is intended as an aid in  the diagnosis of influenza from Nasopharyngeal swab specimens and  should not be used as a sole basis for treatment. Nasal washings and  aspirates are unacceptable for Xpert Xpress SARS-CoV-2/FLU/RSV  testing.  Fact Sheet for Patients: https://www.moore.com/  Fact Sheet for Healthcare Providers: https://www.young.biz/  This test is not yet approved or cleared by the [place] FDA and  has been authorized for detection and/or diagnosis of SARS-CoV-2 by  FDA under an Emergency Use Authorization (EUA). This EUA will remain  in effect (meaning this test can be used) for the duration of the  Covid-19 declaration under Section 564(b)(1) of the Act, 21  U.S.C. section 360bbb-3(b)(1), unless the authorization is  terminated or revoked. Performed at [facility], [address] [place], [place] [address]   MRSA PCR Screening     Status: None   Collection Time: 08/28/20  1:06 AM   Specimen: Nasal Mucosa; Nasopharyngeal  Result Value Ref Range Status   MRSA by PCR NEGATIVE NEGATIVE Final    Comment:        The GeneXpert MRSA Assay (FDA approved for NASAL specimens only), is one component of a comprehensive MRSA colonization surveillance program. It is not intended to diagnose MRSA infection nor to guide  or monitor treatment for MRSA infections. Performed at [facility], [address] [place], [place] [address]      Labs: Basic Metabolic Panel: Recent Labs  Lab 08/27/20 0835 08/28/20 0149  NA 141 139  K 3.7 4.1  CL 106 107  CO2 25 22  GLUCOSE 126* 99  BUN 29* 28*  CREATININE 1.20* 1.17*  CALCIUM 8.9 8.5*  MG 2.4  --    CBC: Recent Labs  Lab 08/27/20 0835 08/28/20 0149  WBC 3.8* 4.8  HGB 13.4 12.5  HCT 42.7 38.9  MCV 92.4 92.0  PLT 261 237   BNP (last 3 results) Recent Labs    08/27/20 0835  BNP 67.0    CBG: Recent Labs  Lab 08/28/20 1113 08/28/20 1603 08/28/20 2121 08/29/20 0723 08/29/20 1113  GLUCAP 137* 135* 106* 82 127*    Signed:  Vassie Loll MD.  [facility] 08/29/2020, 2:00 PM

## 2020-08-29 NOTE — TOC Transition Note (Signed)
Transition of Care [facility]) - CM/SW Discharge Note   Patient Details  Name: Felicia Randolph MRN: [id number] Date of Birth: [birth year]  Transition of Care [facility]) CM/SW Contact:  Barry Brunner, LCSW Phone Number: 08/29/2020, 5:12 PM   Clinical Narrative:    Per Nurse family requested HH. MD provide [facility] order for RN. CSW contacted [place] with [facility]. [facility] agreeable to provide [facility] services. Barbara Cower requested PCP and patient's phone number as it was not listed in patient's chart. Nurse provided CSW with the number [phone]. CSW provided it to [facility]. Patient's daughter reported to Nurse that patient did not yet have a PCP due to the fact that they recently moved to the area. CSW provided PCP list to nurse for patient. Nurse agreeable to give PCP list to patient. Barbara Cower requested that MD sign order for [facility]. MD agreeable to sign order. TOC signing off.   Final next level of care: Home w Home Health Services Barriers to Discharge: Barriers Resolved   Patient Goals and CMS Choice Patient states their goals for this hospitalization and ongoing recovery are:: Return home with [facility]   Choice offered to / list presented to : Patient, Adult Children  Discharge Placement                  Name of family member notified: Kingsley Spittle Patient and family notified of of transfer: 08/29/20  Discharge Plan and Services                          HH Arranged: RN [facility] Agency: [facility] Date HH Agency Contacted: 08/29/20 Time HH Agency Contacted: 1711 Representative spoke with at [facility] Agency: Barbara Cower  Social Determinants of Health (SDOH) Interventions     Readmission Risk Interventions No flowsheet data found.

## 2020-08-29 NOTE — Evaluation (Signed)
Physical Therapy Evaluation Patient Details Name: Felicia Randolph MRN: [id number] DOB: 06-07-1946 Today's Date: 08/29/2020   History of Present Illness  Felicia Randolph presented to [facility] ED on 08/27/2020 for evaluation of chest pain and was found to be in new-onset atrial fibrillation with RVR. She reports being in her usual state of health until yesterday when she developed "congestion" in her chest. Reports similar symptoms in the past but they would spontaneously resolve. Denies any associated dyspnea, nausea, vomiting or diaphoresis. No recent orthopnea, PND or edema.   Clinical Impression    Patient in bed at start of therapy session. Able to perform all bed mobilities and transfers independently. Patient able to ambulate with RW with no difficulties and within normal gait speed with rolling walker which is patient's base line. Patient left in chair with call bell within reach end of session with nursing notified. No skilled physical therapy needs noted at this time secondary to patient functioning near baseline. Patient discharged from physical therapy to nursing staff for ambulation as tolerated daily for length of stay.   Follow Up Recommendations No PT follow up    Equipment Recommendations  None recommended by PT    Recommendations for Other Services       Precautions / Restrictions        Mobility  Bed Mobility Overal bed mobility: Independent                Transfers Overall transfer level: Independent Equipment used: Rolling walker (2 wheeled)                Ambulation/Gait Ambulation/Gait assistance: Independent Gait Distance (Feet): 125 Feet Assistive device: Rolling walker (2 wheeled) Gait Pattern/deviations: WFL(Within Functional Limits) Gait velocity: WNL      Stairs            Wheelchair Mobility    Modified Rankin (Stroke Patients Only)       Balance Overall balance assessment: Modified Independent                                            Pertinent Vitals/Pain Pain Assessment: No/denies pain    Home Living Family/patient expects to be discharged to:: Private residence Living Arrangements: Children (lives with daughter) Available Help at Discharge: Family Type of Home: House Home Access: Level entry     Home Layout: One level Home Equipment: Environmental consultant - 2 wheels      Prior Function Level of Independence: Independent with assistive device(s)         Comments: used RW as needed, assisted with cooking and cleaning as needed     Hand Dominance        Extremity/Trunk Assessment                Communication   Communication: No difficulties  Cognition Arousal/Alertness: Awake/alert Behavior During Therapy: WFL for tasks assessed/performed                                          General Comments      Exercises General Exercises - Lower Extremity Long Arc Quad: AROM;Both;10 reps;Seated Hip Flexion/Marching: AROM;Both;10 reps;Seated Toe Raises: AROM;Both;10 reps;Seated   Assessment/Plan    PT Assessment    PT Problem List  PT Treatment Interventions      PT Goals (Current goals can be found in the Care Plan section)  Acute Rehab PT Goals Patient Stated Goal: to go home PT Goal Formulation: With patient Time For Goal Achievement: 09/12/20 Potential to Achieve Goals: Good    Frequency     Barriers to discharge  none      Co-evaluation               AM-PAC PT "6 Clicks" Mobility  Outcome Measure Help needed turning from your back to your side while in a flat bed without using bedrails?: None Help needed moving from lying on your back to sitting on the side of a flat bed without using bedrails?: None Help needed moving to and from a bed to a chair (including a wheelchair)?: None Help needed standing up from a chair using your arms (e.g., wheelchair or bedside chair)?: None Help needed to walk in hospital room?:  None Help needed climbing 3-5 steps with a railing? : A Little 6 Click Score: 23    End of Session Equipment Utilized During Treatment: Gait belt Activity Tolerance: Patient tolerated treatment well Patient left: in chair;with call bell/phone within reach Nurse Communication: Mobility status;Other (comment) (of patient left in chair) PT Visit Diagnosis: Muscle weakness (generalized) (M62.81)    Time: 2706-2376 PT Time Calculation (min) (ACUTE ONLY): 17 min   Charges:   PT Evaluation $PT Eval Low Complexity: 1 Low         1:39 PM, 08/29/20 Tereasa Coop, DPT Physical Therapy with [facility]  [phone] office

## 2020-09-07 ENCOUNTER — Telehealth: Payer: Self-pay

## 2020-09-08 ENCOUNTER — Ambulatory Visit: Payer: Medicare Other | Admitting: Cardiology

## 2020-09-08 DIAGNOSIS — Z7901 Long term (current) use of anticoagulants: Secondary | ICD-10-CM | POA: Insufficient documentation

## 2021-07-24 IMAGING — DX DG CHEST 1V PORT
1 series · 1 of 1 positions shown · non-contrast
Comparison: None.

CLINICAL DATA: Chest pain.  Hypertension.

EXAM:
PORTABLE CHEST 1 VIEW

[chest ap]
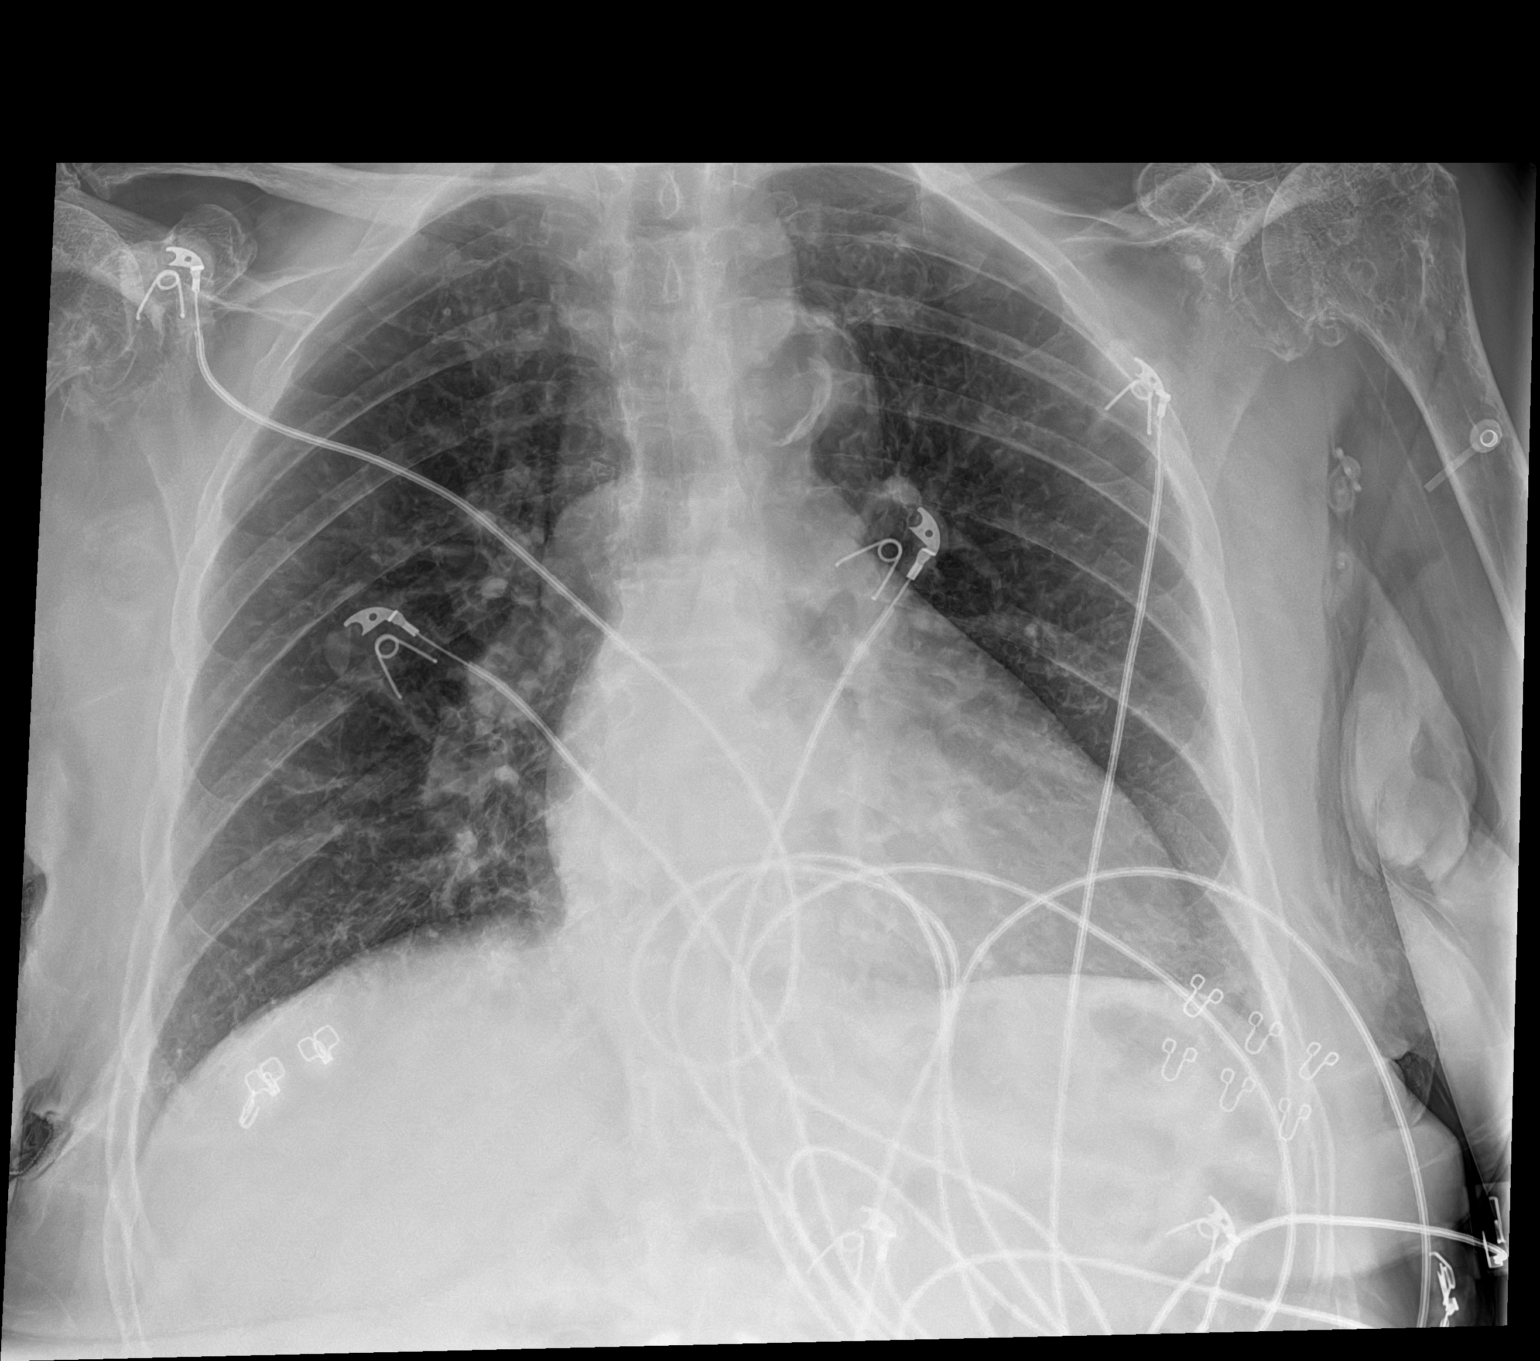

[1 of 1 positions shown; findings below may reference images not displayed]

FINDINGS: No edema or airspace opacity. Heart is mildly enlarged with
pulmonary vascularity normal. No adenopathy. There is aortic
atherosclerosis. There is degenerative change in each shoulder.
Bones are osteoporotic.
IMPRESSION: Lungs clear. Mild cardiac enlargement. No adenopathy. Bones
osteoporotic.

Aortic Atherosclerosis ([id number]).
